# Patient Record
Sex: Male | Born: 1970 | Race: White | Hispanic: No | Marital: Married | Smoking: Never smoker
Health system: Southern US, Community
[De-identification: ages and names within clinical notes are randomized; demographics above are authoritative.]

## PROBLEM LIST (undated history)

## (undated) DIAGNOSIS — G4730 Sleep apnea, unspecified: Secondary | ICD-10-CM

## (undated) DIAGNOSIS — L570 Actinic keratosis: Secondary | ICD-10-CM

## (undated) HISTORY — DX: Actinic keratosis: L57.0

---

## 2007-04-13 ENCOUNTER — Ambulatory Visit: Payer: Self-pay | Admitting: Internal Medicine

## 2009-06-24 IMAGING — CT CT ABD-PELV W/ CM
1 of 2 series · 16 of 32 positions shown, 20 images · non-contrast
Comparison: none

REASON FOR EXAM: RUQ pain r/o appenditis Call report to Dr. Uest (on
call) pager [phone]
COMMENTS:

PROCEDURE:     CT  - CT ABDOMEN / PELVIS  W  - April 13, 2007  [DATE]
RESULT:
HISTORY: RIGHT upper quadrant pain, rule out appendicitis.

[Series 2: appendicitis · axial · 0.68mm/px · z∈[-145,+281]mm · 16 of 156 slices shown, 20 images]
[im 7/156  soft-tissue]
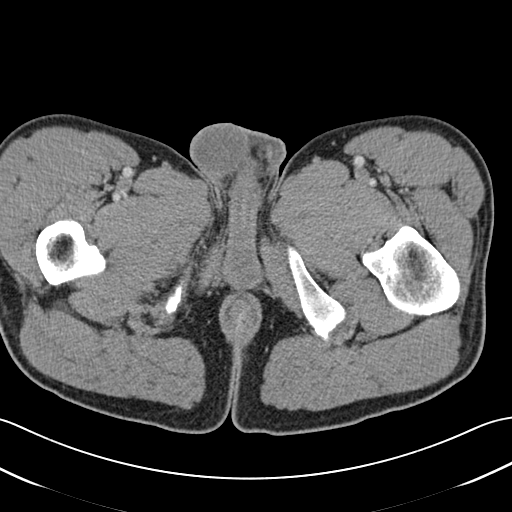
[im 7/156  bone]
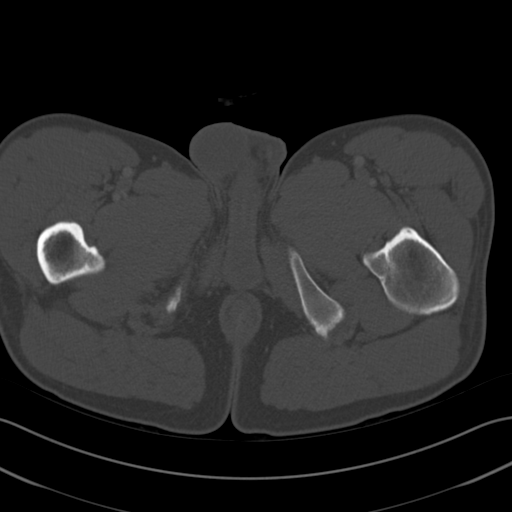
[im 20/156  soft-tissue]
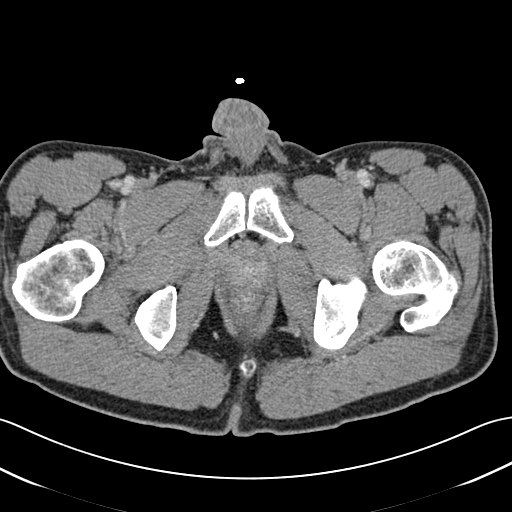
[im 33/156  soft-tissue]
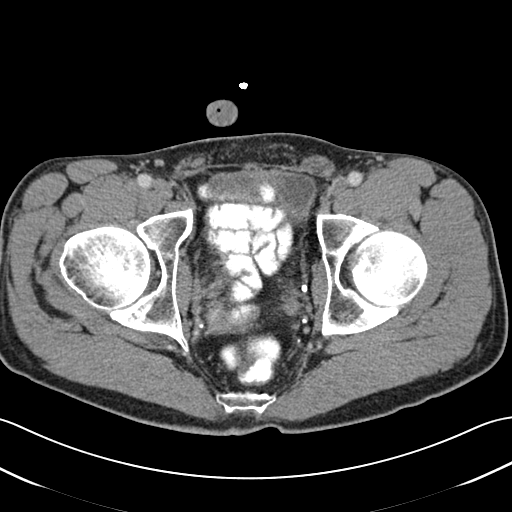
[im 39/156  soft-tissue]
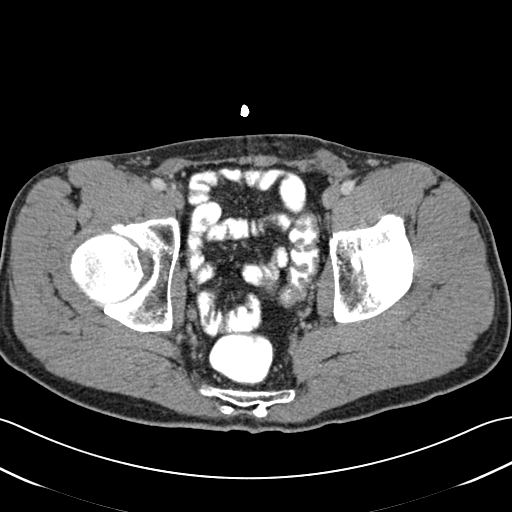
[im 52/156  soft-tissue]
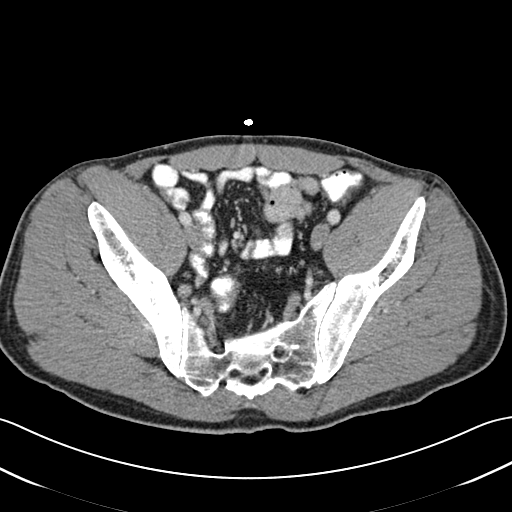
[im 65/156  soft-tissue]
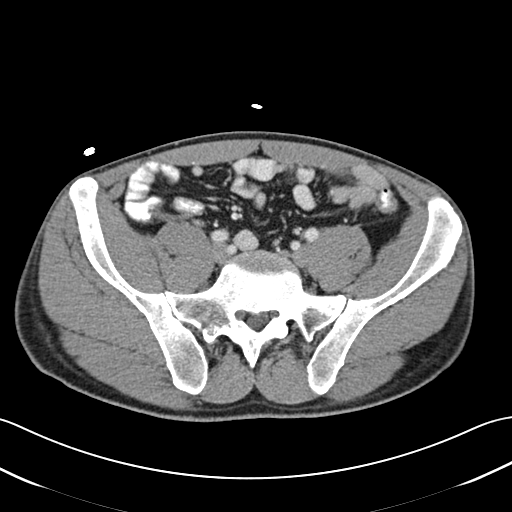
[im 72/156  soft-tissue]
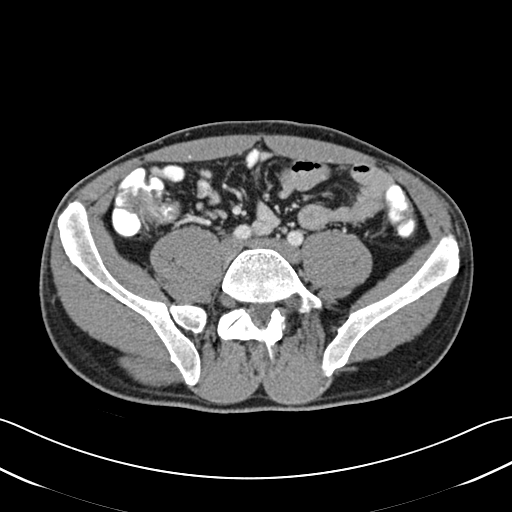
[im 84/156  soft-tissue]
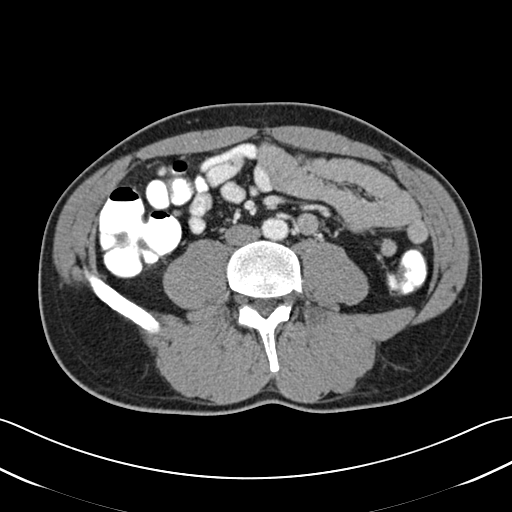
[im 91/156  soft-tissue]
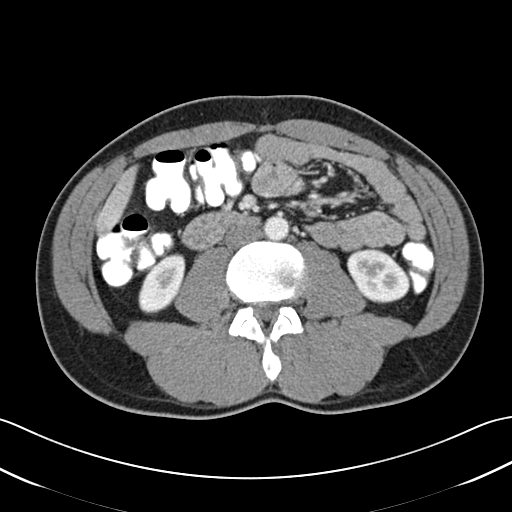
[im 91/156  bone]
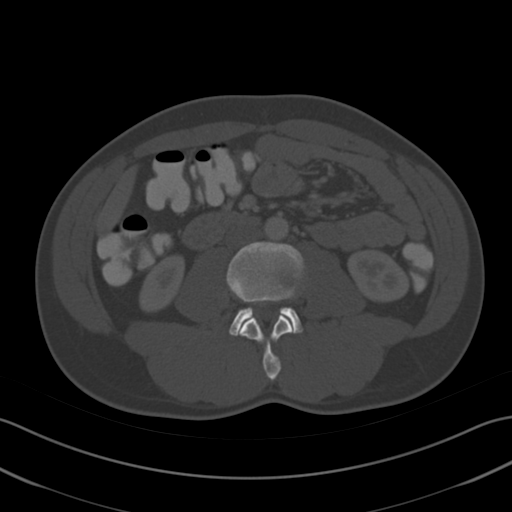
[im 104/156  soft-tissue]
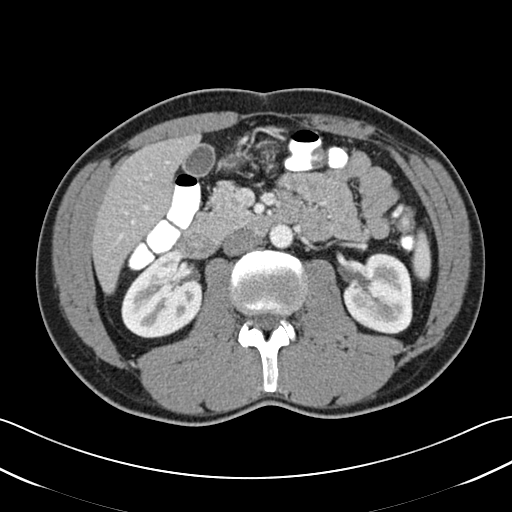
[im 117/156  soft-tissue]
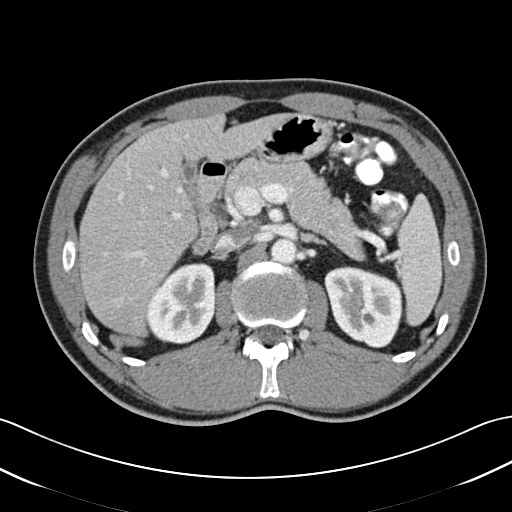
[im 123/156  soft-tissue]
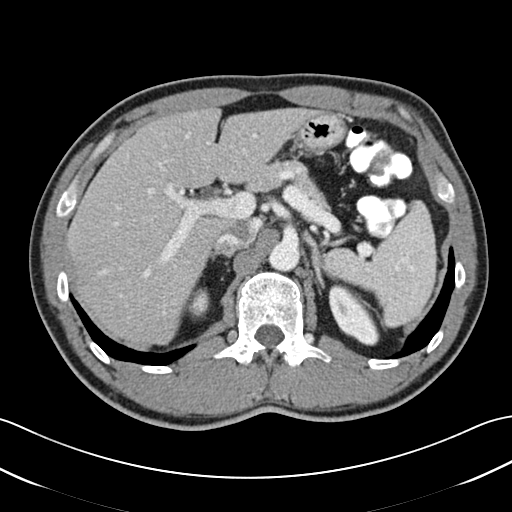
[im 130/156  lung]
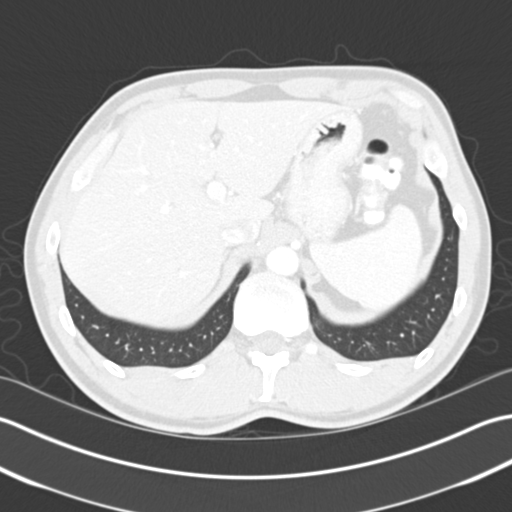
[im 136/156  soft-tissue]
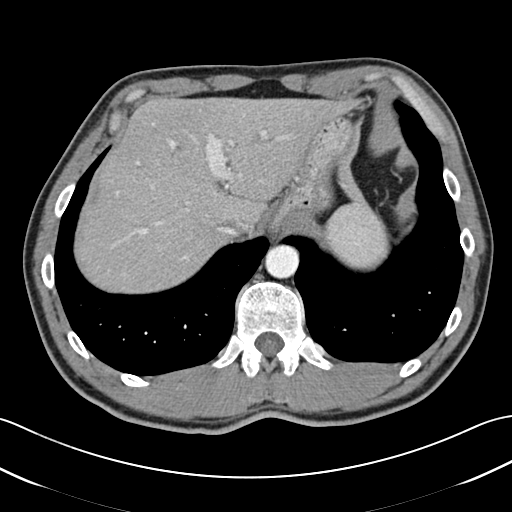
[im 136/156  lung]
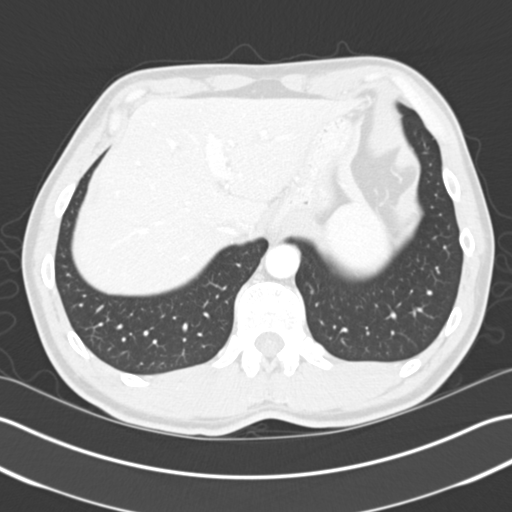
[im 143/156  lung]
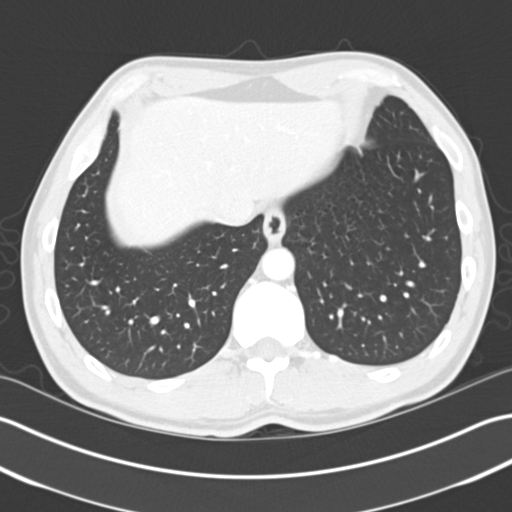
[im 149/156  soft-tissue]
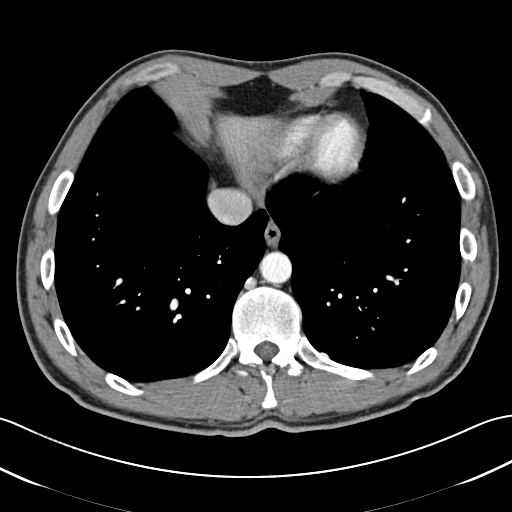
[im 149/156  lung]
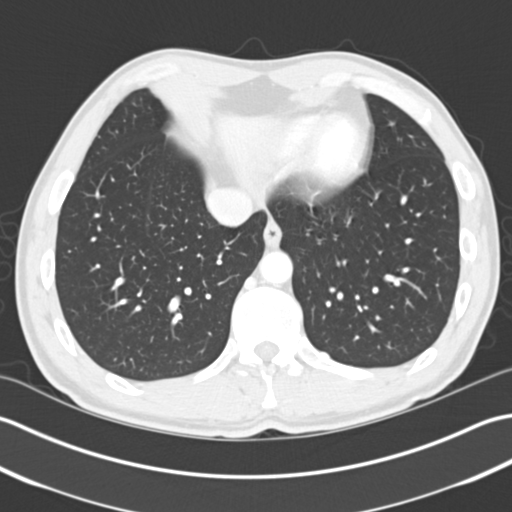

[16 of 32 positions shown; findings below may reference images not displayed]

FINDINGS: IV and oral contrast enhanced CT of the abdomen and pelvis was
obtained.  The liver and spleen are normal. The pancreas is normal.  The
gallbladder is unremarkable.  The adrenals are normal.  No focal renal
abnormality is identified. The abdominal aorta is unremarkable.  The
appendix is not well visualized but appears to be the appendix is normal.
No pericecal abnormalities are identified.  There is  no evidence of bowel
obstruction. Small mesenteric lymph nodes are noted. Mesenteric adenitis
could present in this fashion.  No pelvic abnormalities are identified. No
inguinal adenopathy is noted.
IMPRESSION: Findings suggesting mesenteric adenitis.

## 2009-08-15 ENCOUNTER — Ambulatory Visit: Payer: Self-pay | Admitting: Unknown Physician Specialty

## 2010-05-10 DIAGNOSIS — Z86018 Personal history of other benign neoplasm: Secondary | ICD-10-CM

## 2010-05-10 HISTORY — DX: Personal history of other benign neoplasm: Z86.018

## 2013-05-10 DIAGNOSIS — Z85828 Personal history of other malignant neoplasm of skin: Secondary | ICD-10-CM

## 2013-05-10 HISTORY — DX: Personal history of other malignant neoplasm of skin: Z85.828

## 2017-06-06 DIAGNOSIS — L309 Dermatitis, unspecified: Secondary | ICD-10-CM | POA: Diagnosis not present

## 2017-06-06 DIAGNOSIS — D229 Melanocytic nevi, unspecified: Secondary | ICD-10-CM | POA: Diagnosis not present

## 2017-06-06 DIAGNOSIS — L111 Transient acantholytic dermatosis [Grover]: Secondary | ICD-10-CM | POA: Diagnosis not present

## 2017-07-06 DIAGNOSIS — L609 Nail disorder, unspecified: Secondary | ICD-10-CM | POA: Diagnosis not present

## 2017-07-06 DIAGNOSIS — L309 Dermatitis, unspecified: Secondary | ICD-10-CM | POA: Diagnosis not present

## 2017-07-06 DIAGNOSIS — L111 Transient acantholytic dermatosis [Grover]: Secondary | ICD-10-CM | POA: Diagnosis not present

## 2017-07-20 DIAGNOSIS — M9902 Segmental and somatic dysfunction of thoracic region: Secondary | ICD-10-CM | POA: Diagnosis not present

## 2017-07-20 DIAGNOSIS — M9905 Segmental and somatic dysfunction of pelvic region: Secondary | ICD-10-CM | POA: Diagnosis not present

## 2017-07-20 DIAGNOSIS — M9903 Segmental and somatic dysfunction of lumbar region: Secondary | ICD-10-CM | POA: Diagnosis not present

## 2017-10-12 DIAGNOSIS — M9902 Segmental and somatic dysfunction of thoracic region: Secondary | ICD-10-CM | POA: Diagnosis not present

## 2017-10-12 DIAGNOSIS — M9905 Segmental and somatic dysfunction of pelvic region: Secondary | ICD-10-CM | POA: Diagnosis not present

## 2017-10-12 DIAGNOSIS — M9903 Segmental and somatic dysfunction of lumbar region: Secondary | ICD-10-CM | POA: Diagnosis not present

## 2017-11-14 DIAGNOSIS — M9903 Segmental and somatic dysfunction of lumbar region: Secondary | ICD-10-CM | POA: Diagnosis not present

## 2017-11-14 DIAGNOSIS — M9902 Segmental and somatic dysfunction of thoracic region: Secondary | ICD-10-CM | POA: Diagnosis not present

## 2017-11-14 DIAGNOSIS — M9905 Segmental and somatic dysfunction of pelvic region: Secondary | ICD-10-CM | POA: Diagnosis not present

## 2017-11-22 DIAGNOSIS — M9905 Segmental and somatic dysfunction of pelvic region: Secondary | ICD-10-CM | POA: Diagnosis not present

## 2017-11-22 DIAGNOSIS — M9902 Segmental and somatic dysfunction of thoracic region: Secondary | ICD-10-CM | POA: Diagnosis not present

## 2017-11-22 DIAGNOSIS — M9903 Segmental and somatic dysfunction of lumbar region: Secondary | ICD-10-CM | POA: Diagnosis not present

## 2017-12-21 DIAGNOSIS — M9903 Segmental and somatic dysfunction of lumbar region: Secondary | ICD-10-CM | POA: Diagnosis not present

## 2017-12-21 DIAGNOSIS — M9902 Segmental and somatic dysfunction of thoracic region: Secondary | ICD-10-CM | POA: Diagnosis not present

## 2017-12-21 DIAGNOSIS — M9905 Segmental and somatic dysfunction of pelvic region: Secondary | ICD-10-CM | POA: Diagnosis not present

## 2018-01-17 DIAGNOSIS — M9902 Segmental and somatic dysfunction of thoracic region: Secondary | ICD-10-CM | POA: Diagnosis not present

## 2018-01-17 DIAGNOSIS — M9903 Segmental and somatic dysfunction of lumbar region: Secondary | ICD-10-CM | POA: Diagnosis not present

## 2018-01-17 DIAGNOSIS — M9905 Segmental and somatic dysfunction of pelvic region: Secondary | ICD-10-CM | POA: Diagnosis not present

## 2018-04-11 DIAGNOSIS — M9902 Segmental and somatic dysfunction of thoracic region: Secondary | ICD-10-CM | POA: Diagnosis not present

## 2018-04-11 DIAGNOSIS — M9903 Segmental and somatic dysfunction of lumbar region: Secondary | ICD-10-CM | POA: Diagnosis not present

## 2018-04-11 DIAGNOSIS — M9905 Segmental and somatic dysfunction of pelvic region: Secondary | ICD-10-CM | POA: Diagnosis not present

## 2018-04-24 DIAGNOSIS — L578 Other skin changes due to chronic exposure to nonionizing radiation: Secondary | ICD-10-CM | POA: Diagnosis not present

## 2018-04-24 DIAGNOSIS — Z85828 Personal history of other malignant neoplasm of skin: Secondary | ICD-10-CM | POA: Diagnosis not present

## 2018-04-24 DIAGNOSIS — Z1283 Encounter for screening for malignant neoplasm of skin: Secondary | ICD-10-CM | POA: Diagnosis not present

## 2018-04-27 DIAGNOSIS — M9902 Segmental and somatic dysfunction of thoracic region: Secondary | ICD-10-CM | POA: Diagnosis not present

## 2018-04-27 DIAGNOSIS — M9903 Segmental and somatic dysfunction of lumbar region: Secondary | ICD-10-CM | POA: Diagnosis not present

## 2018-04-27 DIAGNOSIS — M9905 Segmental and somatic dysfunction of pelvic region: Secondary | ICD-10-CM | POA: Diagnosis not present

## 2018-04-28 DIAGNOSIS — Z0000 Encounter for general adult medical examination without abnormal findings: Secondary | ICD-10-CM | POA: Diagnosis not present

## 2018-04-28 DIAGNOSIS — G4700 Insomnia, unspecified: Secondary | ICD-10-CM | POA: Diagnosis not present

## 2018-04-28 DIAGNOSIS — L111 Transient acantholytic dermatosis [Grover]: Secondary | ICD-10-CM | POA: Diagnosis not present

## 2018-05-01 DIAGNOSIS — G4700 Insomnia, unspecified: Secondary | ICD-10-CM | POA: Diagnosis not present

## 2018-05-01 DIAGNOSIS — L111 Transient acantholytic dermatosis [Grover]: Secondary | ICD-10-CM | POA: Diagnosis not present

## 2018-05-01 DIAGNOSIS — Z0000 Encounter for general adult medical examination without abnormal findings: Secondary | ICD-10-CM | POA: Diagnosis not present

## 2018-05-02 DIAGNOSIS — M9903 Segmental and somatic dysfunction of lumbar region: Secondary | ICD-10-CM | POA: Diagnosis not present

## 2018-05-02 DIAGNOSIS — M9905 Segmental and somatic dysfunction of pelvic region: Secondary | ICD-10-CM | POA: Diagnosis not present

## 2018-05-02 DIAGNOSIS — M9902 Segmental and somatic dysfunction of thoracic region: Secondary | ICD-10-CM | POA: Diagnosis not present

## 2018-09-12 DIAGNOSIS — M9902 Segmental and somatic dysfunction of thoracic region: Secondary | ICD-10-CM | POA: Diagnosis not present

## 2018-09-12 DIAGNOSIS — M9905 Segmental and somatic dysfunction of pelvic region: Secondary | ICD-10-CM | POA: Diagnosis not present

## 2018-09-12 DIAGNOSIS — M9903 Segmental and somatic dysfunction of lumbar region: Secondary | ICD-10-CM | POA: Diagnosis not present

## 2019-04-03 DIAGNOSIS — M545 Low back pain: Secondary | ICD-10-CM | POA: Diagnosis not present

## 2019-04-03 DIAGNOSIS — M6080 Other myositis, unspecified site: Secondary | ICD-10-CM | POA: Diagnosis not present

## 2019-04-03 DIAGNOSIS — M9902 Segmental and somatic dysfunction of thoracic region: Secondary | ICD-10-CM | POA: Diagnosis not present

## 2019-04-03 DIAGNOSIS — M461 Sacroiliitis, not elsewhere classified: Secondary | ICD-10-CM | POA: Diagnosis not present

## 2019-04-03 DIAGNOSIS — M9905 Segmental and somatic dysfunction of pelvic region: Secondary | ICD-10-CM | POA: Diagnosis not present

## 2019-04-03 DIAGNOSIS — M62830 Muscle spasm of back: Secondary | ICD-10-CM | POA: Diagnosis not present

## 2019-04-03 DIAGNOSIS — M546 Pain in thoracic spine: Secondary | ICD-10-CM | POA: Diagnosis not present

## 2019-04-03 DIAGNOSIS — M9903 Segmental and somatic dysfunction of lumbar region: Secondary | ICD-10-CM | POA: Diagnosis not present

## 2019-04-26 DIAGNOSIS — D485 Neoplasm of uncertain behavior of skin: Secondary | ICD-10-CM | POA: Diagnosis not present

## 2019-04-26 DIAGNOSIS — D2261 Melanocytic nevi of right upper limb, including shoulder: Secondary | ICD-10-CM | POA: Diagnosis not present

## 2019-04-26 DIAGNOSIS — C44619 Basal cell carcinoma of skin of left upper limb, including shoulder: Secondary | ICD-10-CM | POA: Diagnosis not present

## 2019-04-26 DIAGNOSIS — D225 Melanocytic nevi of trunk: Secondary | ICD-10-CM | POA: Diagnosis not present

## 2019-04-26 DIAGNOSIS — D2262 Melanocytic nevi of left upper limb, including shoulder: Secondary | ICD-10-CM | POA: Diagnosis not present

## 2019-04-26 DIAGNOSIS — L570 Actinic keratosis: Secondary | ICD-10-CM | POA: Diagnosis not present

## 2019-04-26 DIAGNOSIS — L700 Acne vulgaris: Secondary | ICD-10-CM | POA: Diagnosis not present

## 2019-04-26 DIAGNOSIS — Z1283 Encounter for screening for malignant neoplasm of skin: Secondary | ICD-10-CM | POA: Diagnosis not present

## 2019-04-26 DIAGNOSIS — L578 Other skin changes due to chronic exposure to nonionizing radiation: Secondary | ICD-10-CM | POA: Diagnosis not present

## 2019-04-26 DIAGNOSIS — Q825 Congenital non-neoplastic nevus: Secondary | ICD-10-CM | POA: Diagnosis not present

## 2019-06-11 DIAGNOSIS — G4700 Insomnia, unspecified: Secondary | ICD-10-CM | POA: Diagnosis not present

## 2019-06-11 DIAGNOSIS — Z85828 Personal history of other malignant neoplasm of skin: Secondary | ICD-10-CM | POA: Diagnosis not present

## 2019-06-11 DIAGNOSIS — Z0000 Encounter for general adult medical examination without abnormal findings: Secondary | ICD-10-CM | POA: Diagnosis not present

## 2019-06-20 DIAGNOSIS — G4700 Insomnia, unspecified: Secondary | ICD-10-CM | POA: Diagnosis not present

## 2019-06-20 DIAGNOSIS — Z0000 Encounter for general adult medical examination without abnormal findings: Secondary | ICD-10-CM | POA: Diagnosis not present

## 2019-07-10 DIAGNOSIS — C44619 Basal cell carcinoma of skin of left upper limb, including shoulder: Secondary | ICD-10-CM | POA: Diagnosis not present

## 2019-07-18 DIAGNOSIS — Z4802 Encounter for removal of sutures: Secondary | ICD-10-CM | POA: Diagnosis not present

## 2019-07-18 DIAGNOSIS — C44619 Basal cell carcinoma of skin of left upper limb, including shoulder: Secondary | ICD-10-CM | POA: Diagnosis not present

## 2019-08-21 DIAGNOSIS — M9903 Segmental and somatic dysfunction of lumbar region: Secondary | ICD-10-CM | POA: Diagnosis not present

## 2019-08-21 DIAGNOSIS — M6080 Other myositis, unspecified site: Secondary | ICD-10-CM | POA: Diagnosis not present

## 2019-08-21 DIAGNOSIS — M9905 Segmental and somatic dysfunction of pelvic region: Secondary | ICD-10-CM | POA: Diagnosis not present

## 2019-08-21 DIAGNOSIS — M62830 Muscle spasm of back: Secondary | ICD-10-CM | POA: Diagnosis not present

## 2019-08-21 DIAGNOSIS — M545 Low back pain: Secondary | ICD-10-CM | POA: Diagnosis not present

## 2019-08-21 DIAGNOSIS — M461 Sacroiliitis, not elsewhere classified: Secondary | ICD-10-CM | POA: Diagnosis not present

## 2019-08-21 DIAGNOSIS — M9902 Segmental and somatic dysfunction of thoracic region: Secondary | ICD-10-CM | POA: Diagnosis not present

## 2019-08-21 DIAGNOSIS — M546 Pain in thoracic spine: Secondary | ICD-10-CM | POA: Diagnosis not present

## 2019-08-30 DIAGNOSIS — M9902 Segmental and somatic dysfunction of thoracic region: Secondary | ICD-10-CM | POA: Diagnosis not present

## 2019-08-30 DIAGNOSIS — M62830 Muscle spasm of back: Secondary | ICD-10-CM | POA: Diagnosis not present

## 2019-08-30 DIAGNOSIS — M9905 Segmental and somatic dysfunction of pelvic region: Secondary | ICD-10-CM | POA: Diagnosis not present

## 2019-08-30 DIAGNOSIS — M6080 Other myositis, unspecified site: Secondary | ICD-10-CM | POA: Diagnosis not present

## 2019-08-30 DIAGNOSIS — M546 Pain in thoracic spine: Secondary | ICD-10-CM | POA: Diagnosis not present

## 2019-08-30 DIAGNOSIS — M545 Low back pain: Secondary | ICD-10-CM | POA: Diagnosis not present

## 2019-08-30 DIAGNOSIS — M461 Sacroiliitis, not elsewhere classified: Secondary | ICD-10-CM | POA: Diagnosis not present

## 2019-08-30 DIAGNOSIS — M9903 Segmental and somatic dysfunction of lumbar region: Secondary | ICD-10-CM | POA: Diagnosis not present

## 2019-10-16 DIAGNOSIS — M9903 Segmental and somatic dysfunction of lumbar region: Secondary | ICD-10-CM | POA: Diagnosis not present

## 2019-10-16 DIAGNOSIS — M6080 Other myositis, unspecified site: Secondary | ICD-10-CM | POA: Diagnosis not present

## 2019-10-16 DIAGNOSIS — M9905 Segmental and somatic dysfunction of pelvic region: Secondary | ICD-10-CM | POA: Diagnosis not present

## 2019-10-16 DIAGNOSIS — M545 Low back pain: Secondary | ICD-10-CM | POA: Diagnosis not present

## 2019-10-16 DIAGNOSIS — M62830 Muscle spasm of back: Secondary | ICD-10-CM | POA: Diagnosis not present

## 2019-10-16 DIAGNOSIS — M546 Pain in thoracic spine: Secondary | ICD-10-CM | POA: Diagnosis not present

## 2019-10-16 DIAGNOSIS — M461 Sacroiliitis, not elsewhere classified: Secondary | ICD-10-CM | POA: Diagnosis not present

## 2019-10-16 DIAGNOSIS — M9902 Segmental and somatic dysfunction of thoracic region: Secondary | ICD-10-CM | POA: Diagnosis not present

## 2019-10-29 DIAGNOSIS — Z135 Encounter for screening for eye and ear disorders: Secondary | ICD-10-CM | POA: Diagnosis not present

## 2019-10-29 DIAGNOSIS — H524 Presbyopia: Secondary | ICD-10-CM | POA: Diagnosis not present

## 2019-10-29 DIAGNOSIS — H40033 Anatomical narrow angle, bilateral: Secondary | ICD-10-CM | POA: Diagnosis not present

## 2019-12-06 DIAGNOSIS — H40033 Anatomical narrow angle, bilateral: Secondary | ICD-10-CM | POA: Diagnosis not present

## 2019-12-11 DIAGNOSIS — M9902 Segmental and somatic dysfunction of thoracic region: Secondary | ICD-10-CM | POA: Diagnosis not present

## 2019-12-11 DIAGNOSIS — M6080 Other myositis, unspecified site: Secondary | ICD-10-CM | POA: Diagnosis not present

## 2019-12-11 DIAGNOSIS — M461 Sacroiliitis, not elsewhere classified: Secondary | ICD-10-CM | POA: Diagnosis not present

## 2019-12-11 DIAGNOSIS — M9905 Segmental and somatic dysfunction of pelvic region: Secondary | ICD-10-CM | POA: Diagnosis not present

## 2019-12-11 DIAGNOSIS — M9903 Segmental and somatic dysfunction of lumbar region: Secondary | ICD-10-CM | POA: Diagnosis not present

## 2019-12-11 DIAGNOSIS — M546 Pain in thoracic spine: Secondary | ICD-10-CM | POA: Diagnosis not present

## 2019-12-11 DIAGNOSIS — M545 Low back pain: Secondary | ICD-10-CM | POA: Diagnosis not present

## 2019-12-11 DIAGNOSIS — M62830 Muscle spasm of back: Secondary | ICD-10-CM | POA: Diagnosis not present

## 2020-02-13 ENCOUNTER — Other Ambulatory Visit: Payer: Self-pay

## 2020-02-13 ENCOUNTER — Telehealth (INDEPENDENT_AMBULATORY_CARE_PROVIDER_SITE_OTHER): Payer: Self-pay | Admitting: Gastroenterology

## 2020-02-13 DIAGNOSIS — Z1211 Encounter for screening for malignant neoplasm of colon: Secondary | ICD-10-CM

## 2020-02-13 MED ORDER — NA SULFATE-K SULFATE-MG SULF 17.5-3.13-1.6 GM/177ML PO SOLN: 1.0000 | Freq: Once | ORAL | 0 refills | Status: DC

## 2020-02-13 NOTE — Progress Notes (Signed)
Gastroenterology Pre-Procedure Review  Request Date: Thursday 03/14/23 Requesting Physician: Dr. Vicente Males  PATIENT REVIEW QUESTIONS: The patient responded to the following health history questions as indicated:    1. Are you having any GI issues? occasional heartburn 2. Do you have a personal history of Polyps? yes (08/15/09 Dr. Vira Agar removed hyperplastic polyp.  Colonoscopy repeated by Dr. Vira Agar on 10/15/14 no polyps) 3. Do you have a family history of Colon Cancer or Polyps? yes (father colon polyps, both paternal grandparents had colon cancer) 8. Diabetes Mellitus? no 5. Joint replacements in the past 12 months?no 6. Major health problems in the past 3 months?no 7. Any artificial heart valves, MVP, or defibrillator?no8. Patient does have sleep apnea.      MEDICATIONS & ALLERGIES:    Patient reports the following regarding taking any anticoagulation/antiplatelet therapy:   Plavix, Coumadin, Eliquis, Xarelto, Lovenox, Pradaxa, Brilinta, or Effient? no Aspirin? no  Patient confirms/reports the following medications:  Current Outpatient Medications  Medication Sig Dispense Refill  . diphenhydrAMINE HCl (ALLERGY MEDICATION PO) Take by mouth as needed. As Needed     No current facility-administered medications for this visit.    Patient confirms/reports the following allergies:  No Known Allergies  No orders of the defined types were placed in this encounter.   AUTHORIZATION INFORMATION Primary Insurance: 1D#: Group #:  Secondary Insurance: 1D#: Group #:  SCHEDULE INFORMATION: Date: Thursday 03/13/20 Time: Location:[facility]

## 2020-02-14 DIAGNOSIS — M461 Sacroiliitis, not elsewhere classified: Secondary | ICD-10-CM | POA: Diagnosis not present

## 2020-02-14 DIAGNOSIS — M9902 Segmental and somatic dysfunction of thoracic region: Secondary | ICD-10-CM | POA: Diagnosis not present

## 2020-02-14 DIAGNOSIS — M62830 Muscle spasm of back: Secondary | ICD-10-CM | POA: Diagnosis not present

## 2020-02-14 DIAGNOSIS — M546 Pain in thoracic spine: Secondary | ICD-10-CM | POA: Diagnosis not present

## 2020-02-14 DIAGNOSIS — M9905 Segmental and somatic dysfunction of pelvic region: Secondary | ICD-10-CM | POA: Diagnosis not present

## 2020-02-14 DIAGNOSIS — M9903 Segmental and somatic dysfunction of lumbar region: Secondary | ICD-10-CM | POA: Diagnosis not present

## 2020-02-14 DIAGNOSIS — M545 Low back pain, unspecified: Secondary | ICD-10-CM | POA: Diagnosis not present

## 2020-02-14 DIAGNOSIS — M6080 Other myositis, unspecified site: Secondary | ICD-10-CM | POA: Diagnosis not present

## 2020-02-18 ENCOUNTER — Ambulatory Visit: Payer: 59 | Attending: Internal Medicine

## 2020-02-18 DIAGNOSIS — Z23 Encounter for immunization: Secondary | ICD-10-CM

## 2020-02-18 NOTE — Progress Notes (Signed)
   Covid-19 Vaccination Clinic  Name:  Carvin Almas    MRN: [id number] DOB: 07-05-1970  02/18/2020  Mr. Sheffler was observed post Covid-19 immunization for 15 minutes without incident. He was provided with Vaccine Information Sheet and instruction to access the V-Safe system.   Mr. Zelman was instructed to call 911 with any severe reactions post vaccine: Marland Kitchen Difficulty breathing  . Swelling of face and throat  . A fast heartbeat  . A bad rash all over body  . Dizziness and weakness

## 2020-03-11 ENCOUNTER — Other Ambulatory Visit: Payer: Self-pay

## 2020-03-11 ENCOUNTER — Other Ambulatory Visit
Admission: RE | Admit: 2020-03-11 | Discharge: 2020-03-11 | Disposition: A | Discharge status: Home / Self Care | Payer: 59 | Source: Ambulatory Visit | Attending: Gastroenterology | Admitting: Gastroenterology

## 2020-03-11 DIAGNOSIS — Z20822 Contact with and (suspected) exposure to covid-19: Secondary | ICD-10-CM | POA: Diagnosis not present

## 2020-03-11 DIAGNOSIS — Z01812 Encounter for preprocedural laboratory examination: Secondary | ICD-10-CM | POA: Insufficient documentation

## 2020-03-11 LAB — SARS CORONAVIRUS 2 (TAT 6-24 HRS): SARS Coronavirus 2: NEGATIVE

## 2020-03-12 ENCOUNTER — Encounter: Payer: Self-pay | Admitting: Gastroenterology

## 2020-03-13 ENCOUNTER — Ambulatory Visit: Payer: 59 | Admitting: Certified Registered Nurse Anesthetist

## 2020-03-13 ENCOUNTER — Ambulatory Visit
Admission: RE | Admit: 2020-03-13 | Discharge: 2020-03-13 | Disposition: A | Discharge status: Home / Self Care | Payer: 59 | Attending: Gastroenterology | Admitting: Gastroenterology

## 2020-03-13 ENCOUNTER — Encounter: Payer: Self-pay | Admitting: Gastroenterology

## 2020-03-13 ENCOUNTER — Other Ambulatory Visit: Payer: Self-pay

## 2020-03-13 ENCOUNTER — Encounter
Admission: RE | Disposition: A | Discharge status: Home / Self Care | Payer: Self-pay | Source: Home / Self Care | Attending: Gastroenterology

## 2020-03-13 DIAGNOSIS — K640 First degree hemorrhoids: Secondary | ICD-10-CM | POA: Diagnosis not present

## 2020-03-13 DIAGNOSIS — Z86010 Personal history of colonic polyps: Secondary | ICD-10-CM | POA: Diagnosis not present

## 2020-03-13 DIAGNOSIS — Z8719 Personal history of other diseases of the digestive system: Secondary | ICD-10-CM | POA: Diagnosis not present

## 2020-03-13 DIAGNOSIS — K635 Polyp of colon: Secondary | ICD-10-CM | POA: Diagnosis not present

## 2020-03-13 DIAGNOSIS — Z1211 Encounter for screening for malignant neoplasm of colon: Secondary | ICD-10-CM | POA: Insufficient documentation

## 2020-03-13 HISTORY — PX: COLONOSCOPY WITH PROPOFOL: SHX5780

## 2020-03-13 HISTORY — DX: Sleep apnea, unspecified: G47.30

## 2020-03-13 SURGERY — COLONOSCOPY WITH PROPOFOL
Anesthesia: General

## 2020-03-13 MED ORDER — PROPOFOL 10 MG/ML IV BOLUS
INTRAVENOUS | Status: AC
  Filled 2020-03-13: qty 20

## 2020-03-13 MED ORDER — LIDOCAINE HCL (CARDIAC) PF 100 MG/5ML IV SOSY
PREFILLED_SYRINGE | INTRAVENOUS | Status: DC | PRN
  Administered 2020-03-13: 50 mg via INTRAVENOUS

## 2020-03-13 MED ORDER — PROPOFOL 500 MG/50ML IV EMUL
INTRAVENOUS | Status: AC
  Filled 2020-03-13: qty 50

## 2020-03-13 MED ORDER — PROPOFOL 10 MG/ML IV BOLUS
INTRAVENOUS | Status: DC | PRN
  Administered 2020-03-13 (×2): 40 mg via INTRAVENOUS

## 2020-03-13 MED ORDER — SODIUM CHLORIDE 0.9 % IV SOLN: INTRAVENOUS | Status: DC

## 2020-03-13 MED ORDER — PROPOFOL 500 MG/50ML IV EMUL
INTRAVENOUS | Status: DC | PRN
  Administered 2020-03-13: 125 ug/kg/min via INTRAVENOUS

## 2020-03-13 NOTE — H&P (Signed)
Jonathon Bellows, MD [address] [place], [place], [place], [address] [address], [place], [place], [place], [address] Phone: [phone]  Fax: [phone]  Primary Care Physician:  Tracie Harrier, MD   Pre-Procedure History & Physical: HPI:  Kyle Lee is a 49 y.o. male is here for an colonoscopy.   Past Medical History:  Diagnosis Date  . Hx of basal cell carcinoma 2015   right medial cheek   . Hx of dysplastic nevus 2012   multiple sites  . Sleep apnea    No CPAP    History reviewed. No pertinent surgical history.  Prior to Admission medications   Medication Sig Start Date End Date Taking? Authorizing Provider  diphenhydrAMINE HCl (ALLERGY MEDICATION PO) Take by mouth as needed. As Needed   Yes [provider]    Allergies as of 02/13/2020  . (No Known Allergies)    History reviewed. No pertinent family history.  Social History   Socioeconomic History  . Marital status: Married    Spouse name: Not on file  . Number of children: Not on file  . Years of education: Not on file  . Highest education level: Not on file  Occupational History  . Not on file  Tobacco Use  . Smoking status: Never Smoker  . Smokeless tobacco: Never Used  Vaping Use  . Vaping Use: Never used  Substance and Sexual Activity  . Alcohol use: Never  . Drug use: Not Currently  . Sexual activity: Not on file  Other Topics Concern  . Not on file  Social History Narrative  . Not on file   Social Determinants of Health   Financial Resource Strain:   . Difficulty of Paying Living Expenses: Not on file  Food Insecurity:   . Worried About Charity fundraiser in the Last Year: Not on file  . Ran Out of Food in the Last Year: Not on file  Transportation Needs:   . Lack of Transportation (Medical): Not on file  . Lack of Transportation (Non-Medical): Not on file  Physical Activity:   . Days of Exercise per Week: Not on file  . Minutes of Exercise per Session: Not on  file  Stress:   . Feeling of Stress : Not on file  Social Connections:   . Frequency of Communication with Friends and Family: Not on file  . Frequency of Social Gatherings with Friends and Family: Not on file  . Attends Religious Services: Not on file  . Active Member of Clubs or Organizations: Not on file  . Attends Archivist Meetings: Not on file  . Marital Status: Not on file  Intimate Partner Violence:   . Fear of Current or Ex-Partner: Not on file  . Emotionally Abused: Not on file  . Physically Abused: Not on file  . Sexually Abused: Not on file    Review of Systems: See HPI, otherwise negative ROS  Physical Exam: BP 127/87   Pulse 91   Temp (!) 96.4 F (35.8 C) (Temporal)   Resp 17   Ht 6' (1.829 m)   Wt 81.6 kg   SpO2 97%   BMI 24.41 kg/m  General:   Alert,  pleasant and cooperative in NAD Head:  Normocephalic and atraumatic. Neck:  Supple; no masses or thyromegaly. Lungs:  Clear throughout to auscultation, normal respiratory effort.    Heart:  +S1, +S2, Regular rate and rhythm, No edema. Abdomen:  Soft, nontender and nondistended. Normal bowel sounds, without guarding,  and without rebound.   Neurologic:  Alert and  oriented x4;  grossly normal neurologically.  Impression/Plan: Kyle Lee is here for an colonoscopy to be performed for surveillance due to prior history of colon polyps   Risks, benefits, limitations, and alternatives regarding  colonoscopy have been reviewed with the patient.  Questions have been answered.  All parties agreeable.   Jonathon Bellows, MD  03/13/2020, 8:07 AM

## 2020-03-13 NOTE — Anesthesia Preprocedure Evaluation (Signed)
Anesthesia Evaluation  Patient identified by MRN, date of birth, ID band Patient awake    Reviewed: Allergy & Precautions, NPO status , Patient's Chart, lab work & pertinent test results  Airway Mallampati: II  TM Distance: >3 FB     Dental no notable dental hx. (+) Teeth Intact   Pulmonary sleep apnea ,    Pulmonary exam normal        Cardiovascular negative cardio ROS Normal cardiovascular exam     Neuro/Psych negative neurological ROS  negative psych ROS   GI/Hepatic negative GI ROS, Neg liver ROS,   Endo/Other  negative endocrine ROS  Renal/GU negative Renal ROS  negative genitourinary   Musculoskeletal negative musculoskeletal ROS (+)   Abdominal Normal abdominal exam  (+)   Peds negative pediatric ROS (+)  Hematology negative hematology ROS (+)   Anesthesia Other Findings Past Medical History: 2015: Hx of basal cell carcinoma     Comment:  right medial cheek  2012: Hx of dysplastic nevus     Comment:  multiple sites No date: Sleep apnea     Comment:  No CPAP  Reproductive/Obstetrics                             Anesthesia Physical Anesthesia Plan  ASA: II  Anesthesia Plan: General   Post-op Pain Management:    Induction: Intravenous  PONV Risk Score and Plan: Propofol infusion  Airway Management Planned: Nasal Cannula  Additional Equipment:   Intra-op Plan:   Post-operative Plan:   Informed Consent: I have reviewed the patients History and Physical, chart, labs and discussed the procedure including the risks, benefits and alternatives for the proposed anesthesia with the patient or authorized representative who has indicated his/her understanding and acceptance.     Dental advisory given  Plan Discussed with: CRNA and Surgeon  Anesthesia Plan Comments:         Anesthesia Quick Evaluation

## 2020-03-13 NOTE — Anesthesia Postprocedure Evaluation (Signed)
Anesthesia Post Note  Patient: Kyle Lee  Procedure(s) Performed: COLONOSCOPY WITH PROPOFOL (N/A )  Patient location during evaluation: Endoscopy Anesthesia Type: General Level of consciousness: awake and alert and oriented Pain management: pain level controlled Vital Signs Assessment: post-procedure vital signs reviewed and stable Respiratory status: spontaneous breathing Cardiovascular status: blood pressure returned to baseline Anesthetic complications: no   No complications documented.   Last Vitals:  Vitals:   03/13/20 0834 03/13/20 0838  BP: (!) 88/58 (!) 87/61  Pulse:    Resp:    Temp: (!) 35.9 C   SpO2:      Last Pain:  Vitals:   03/13/20 0844  TempSrc:   PainSc: 0-No pain                 Marikay Roads

## 2020-03-13 NOTE — Op Note (Signed)
[facility] Gastroenterology Patient Name: Kyle Lee Procedure Date: 03/13/2020 8:06 AM MRN: [id number] Account #: [id number] Date of Birth: Nov 16, 1970 Admit Type: Outpatient Age: 49 Room: [facility] ENDO ROOM 3 Gender: Male Note Status: Finalized Procedure:             Colonoscopy Indications:           High risk colon cancer surveillance: Personal history                         of colonic polyps, Last colonoscopy: April 2011 Providers:             Jonathon Bellows MD, MD Referring MD:          Tracie Harrier, MD (Referring MD) Medicines:             Monitored Anesthesia Care Complications:         No immediate complications. Procedure:             Pre-Anesthesia Assessment:                        - Prior to the procedure, a History and Physical was                         performed, and patient medications, allergies and                         sensitivities were reviewed. The patient's tolerance                         of previous anesthesia was reviewed.                        - The risks and benefits of the procedure and the                         sedation options and risks were discussed with the                         patient. All questions were answered and informed                         consent was obtained.                        - ASA Grade Assessment: II - A patient with mild                         systemic disease.                        After obtaining informed consent, the colonoscope was                         passed under direct vision. Throughout the procedure,                         the patient's blood pressure, pulse, and oxygen                         saturations were monitored  continuously. The                         Colonoscope was introduced through the anus and                         advanced to the the cecum, identified by the                         appendiceal orifice. The colonoscopy was performed                         with  ease. The patient tolerated the procedure well.                         The quality of the bowel preparation was excellent. Findings:      The perianal and digital rectal examinations were normal.      Two sessile polyps were found in the transverse colon. The polyps were 3       to 4 mm in size. These polyps were removed with a cold snare. Resection       and retrieval were complete.      Non-bleeding internal hemorrhoids were found during retroflexion. The       hemorrhoids were medium-sized and Grade I (internal hemorrhoids that do       not prolapse).      The exam was otherwise without abnormality on direct and retroflexion       views. Impression:            - Two 3 to 4 mm polyps in the transverse colon,                         removed with a cold snare. Resected and retrieved.                        - Non-bleeding internal hemorrhoids.                        - The examination was otherwise normal on direct and                         retroflexion views. Recommendation:        - Discharge patient to home (with escort).                        - Resume previous diet.                        - Continue present medications.                        - Await pathology results.                        - Repeat colonoscopy for surveillance based on                         pathology results. Procedure Code(s):     --- Professional ---  45385, Colonoscopy, flexible; with removal of                         tumor(s), polyp(s), or other lesion(s) by snare                         technique Diagnosis Code(s):     --- Professional ---                        Z86.010, Personal history of colonic polyps                        K63.5, Polyp of colon                        K64.0, First degree hemorrhoids CPT copyright 2019 American Medical Association. All rights reserved. The codes documented in this report are preliminary and upon coder review may  be revised to meet current  compliance requirements. Jonathon Bellows, MD Jonathon Bellows MD, MD 03/13/2020 8:33:56 AM This report has been signed electronically. Number of Addenda: 0 Note Initiated On: 03/13/2020 8:06 AM Scope Withdrawal Time: 0 hours 15 minutes 13 seconds  Total Procedure Duration: 0 hours 19 minutes 17 seconds  Estimated Blood Loss:  Estimated blood loss: none.      [facility]

## 2020-03-13 NOTE — Transfer of Care (Signed)
Immediate Anesthesia Transfer of Care Note  Patient: Kyle Lee  Procedure(s) Performed: COLONOSCOPY WITH PROPOFOL (N/A )  Patient Location: PACU  Anesthesia Type:General  Level of Consciousness: awake, alert  and oriented  Airway & Oxygen Therapy: Patient Spontanous Breathing and Patient connected to nasal cannula oxygen  Post-op Assessment: Report given to RN and Post -op Vital signs reviewed and stable  Post vital signs: Reviewed and stable  Last Vitals:  Vitals Value Taken Time  BP 88/58 03/13/20 0835  Temp    Pulse 67 03/13/20 0836  Resp 8 03/13/20 0836  SpO2 98 % 03/13/20 0836  Vitals shown include unvalidated device data.  Last Pain:  Vitals:   03/13/20 0714  TempSrc: Temporal  PainSc: 0-No pain         Complications: No complications documented.

## 2020-03-14 ENCOUNTER — Encounter: Payer: Self-pay | Admitting: Gastroenterology

## 2020-03-14 LAB — SURGICAL PATHOLOGY

## 2020-03-17 ENCOUNTER — Ambulatory Visit (INDEPENDENT_AMBULATORY_CARE_PROVIDER_SITE_OTHER): Payer: 59 | Admitting: Dermatology

## 2020-03-17 ENCOUNTER — Encounter: Payer: Self-pay | Admitting: Dermatology

## 2020-03-17 ENCOUNTER — Encounter: Payer: Self-pay | Admitting: Gastroenterology

## 2020-03-17 ENCOUNTER — Other Ambulatory Visit: Payer: Self-pay

## 2020-03-17 DIAGNOSIS — Z85828 Personal history of other malignant neoplasm of skin: Secondary | ICD-10-CM

## 2020-03-17 DIAGNOSIS — L820 Inflamed seborrheic keratosis: Secondary | ICD-10-CM | POA: Diagnosis not present

## 2020-03-17 DIAGNOSIS — B079 Viral wart, unspecified: Secondary | ICD-10-CM

## 2020-03-17 DIAGNOSIS — L821 Other seborrheic keratosis: Secondary | ICD-10-CM

## 2020-03-17 DIAGNOSIS — D229 Melanocytic nevi, unspecified: Secondary | ICD-10-CM

## 2020-03-17 NOTE — Progress Notes (Signed)
   Follow-Up Visit   Subjective  Kyle Lee is a 49 y.o. male who presents for the following: Lesion (on the left cheek - present for about 3 weeks, pt c/o itching and has been using Lotrimin AF cream). He also has a wart on finger.  He has hx of BCC.  The following portions of the chart were reviewed this encounter and updated as appropriate:  Tobacco  Allergies  Meds  Problems  Med Hx  Surg Hx  Fam Hx     Review of Systems:  No other skin or systemic complaints except as noted in HPI or Assessment and Plan.  Objective  Well appearing patient in no apparent distress; mood and affect are within normal limits.  A focused examination was performed including face . Relevant physical exam findings are noted in the Assessment and Plan.  Objective  L sideburn x 1: Erythematous keratotic or waxy stuck-on papule or plaque.   Objective  R 4th finger: Verrucous papules -- Discussed viral etiology and contagion.   Assessment & Plan  Inflamed seborrheic keratosis L sideburn x 1  Destruction of lesion - L sideburn x 1 Complexity: simple   Destruction method: cryotherapy   Informed consent: discussed and consent obtained   Timeout:  patient name, date of birth, surgical site, and procedure verified Lesion destroyed using liquid nitrogen: Yes   Region frozen until ice ball extended beyond lesion: Yes   Outcome: patient tolerated procedure well with no complications   Post-procedure details: wound care instructions given    Viral warts, unspecified type R 4th finger  Destruction of lesion - R 4th finger Complexity: simple   Destruction method: cryotherapy   Informed consent: discussed and consent obtained   Timeout:  patient name, date of birth, surgical site, and procedure verified Lesion destroyed using liquid nitrogen: Yes   Region frozen until ice ball extended beyond lesion: Yes   Outcome: patient tolerated procedure well with no complications   Post-procedure  details: wound care instructions given     Seborrheic Keratoses - Stuck-on, waxy, tan-brown papules and plaques  - Discussed benign etiology and prognosis. - Observe - Call for any changes  Melanocytic Nevi - Tan-brown and/or pink-flesh-colored symmetric macules and papules - Benign appearing on exam today - Observation - Call clinic for new or changing moles - Recommend daily use of broad spectrum spf 30+ sunscreen to sun-exposed areas.   History of Basal Cell Carcinoma of the Skin - No evidence of recurrence today - Recommend regular full body skin exams - Recommend daily broad spectrum sunscreen SPF 30+ to sun-exposed areas, reapply every 2 hours as needed.  - Call if any new or changing lesions are noted between office visits  Return for TBSE, appointment as scheduled.  Luther Redo, CMA, am acting as scribe for Sarina Ser, MD .  Documentation: I have reviewed the above documentation for accuracy and completeness, and I agree with the above.  Sarina Ser, MD

## 2020-04-28 ENCOUNTER — Encounter: Payer: Self-pay | Admitting: Dermatology

## 2020-05-21 DIAGNOSIS — H40033 Anatomical narrow angle, bilateral: Secondary | ICD-10-CM | POA: Diagnosis not present

## 2020-06-04 ENCOUNTER — Other Ambulatory Visit: Payer: Self-pay

## 2020-06-04 ENCOUNTER — Encounter: Payer: Self-pay | Admitting: Dermatology

## 2020-06-04 ENCOUNTER — Other Ambulatory Visit: Payer: Self-pay | Admitting: Dermatology

## 2020-06-04 ENCOUNTER — Ambulatory Visit (INDEPENDENT_AMBULATORY_CARE_PROVIDER_SITE_OTHER): Payer: 59 | Admitting: Dermatology

## 2020-06-04 DIAGNOSIS — Z85828 Personal history of other malignant neoplasm of skin: Secondary | ICD-10-CM | POA: Diagnosis not present

## 2020-06-04 DIAGNOSIS — Z1283 Encounter for screening for malignant neoplasm of skin: Secondary | ICD-10-CM | POA: Diagnosis not present

## 2020-06-04 DIAGNOSIS — L814 Other melanin hyperpigmentation: Secondary | ICD-10-CM

## 2020-06-04 DIAGNOSIS — L578 Other skin changes due to chronic exposure to nonionizing radiation: Secondary | ICD-10-CM

## 2020-06-04 DIAGNOSIS — L738 Other specified follicular disorders: Secondary | ICD-10-CM | POA: Diagnosis not present

## 2020-06-04 DIAGNOSIS — I781 Nevus, non-neoplastic: Secondary | ICD-10-CM

## 2020-06-04 DIAGNOSIS — L821 Other seborrheic keratosis: Secondary | ICD-10-CM

## 2020-06-04 DIAGNOSIS — D229 Melanocytic nevi, unspecified: Secondary | ICD-10-CM | POA: Diagnosis not present

## 2020-06-04 DIAGNOSIS — Z86018 Personal history of other benign neoplasm: Secondary | ICD-10-CM | POA: Diagnosis not present

## 2020-06-04 DIAGNOSIS — L111 Transient acantholytic dermatosis [Grover]: Secondary | ICD-10-CM

## 2020-06-04 DIAGNOSIS — D1800 Hemangioma unspecified site: Secondary | ICD-10-CM

## 2020-06-04 DIAGNOSIS — L820 Inflamed seborrheic keratosis: Secondary | ICD-10-CM | POA: Diagnosis not present

## 2020-06-04 MED ORDER — CLINDAMYCIN PHOSPHATE 1 % EX LOTN: TOPICAL_LOTION | Freq: Two times a day (BID) | CUTANEOUS | 11 refills | Status: DC

## 2020-06-04 NOTE — Patient Instructions (Addendum)

## 2020-06-04 NOTE — Progress Notes (Unsigned)
Follow-Up Visit   Subjective  Kyle Lee is a 50 y.o. male who presents for the following: Annual Exam. Pt c/o irritated itchy growths on the scalp and back he would like removed. Hx of BCC, Hx of Dysplastic nevus. The patient presents for Total-Body Skin Exam (TBSE) for skin cancer screening and mole check.  The following portions of the chart were reviewed this encounter and updated as appropriate:   Tobacco  Allergies  Meds  Problems  Med Hx  Surg Hx  Fam Hx     Review of Systems:  No other skin or systemic complaints except as noted in HPI or Assessment and Plan.  Objective  Well appearing patient in no apparent distress; mood and affect are within normal limits.  A full examination was performed including scalp, head, eyes, ears, nose, lips, neck, chest, axillae, abdomen, back, buttocks, bilateral upper extremities, bilateral lower extremities, hands, feet, fingers, toes, fingernails, and toenails. All findings within normal limits unless otherwise noted below.  Objective  multiple see history: Well healed scar with no evidence of recurrence.   Objective  multiple see history: Scar with no evidence of recurrence.   Objective  scalp x 3, back x 1 (4): Erythematous keratotic or waxy stuck-on papule or plaque.   Objective  exts, trunk: Red papules and papulovesicles.   Objective  L upper lip vermillion border: yellow papule   Images     Assessment & Plan  History of basal cell carcinoma (BCC) multiple see history  Clear. Observe for recurrence. Call clinic for new or changing lesions.  Recommend regular skin exams, daily broad-spectrum spf 30+ sunscreen use, and photoprotection.     History of dysplastic nevus multiple see history  Clear. Observe for recurrence. Call clinic for new or changing lesions.  Recommend regular skin exams, daily broad-spectrum spf 30+ sunscreen use, and photoprotection.     Inflamed seborrheic keratosis (4) scalp x 3,  back x 1  Destruction of lesion - scalp x 3, back x 1 Complexity: simple   Destruction method: cryotherapy   Informed consent: discussed and consent obtained   Timeout:  patient name, date of birth, surgical site, and procedure verified Lesion destroyed using liquid nitrogen: Yes   Region frozen until ice ball extended beyond lesion: Yes   Outcome: patient tolerated procedure well with no complications   Post-procedure details: wound care instructions given    Grover's disease exts, trunk Chronic and persistent  Stable  Cont Clindamycin lotion daily  Ordered Medications: clindamycin (CLEOCIN-T) 1 % lotion  Sebaceous hyperplasia L upper lip vermillion border Benign-appearing.  Observation.  Call clinic for new or changing lesions.  Recommend daily use of broad spectrum spf 30+ sunscreen to sun-exposed areas.   Lentigines - Scattered tan macules - Discussed due to sun exposure - Benign, observe - Call for any changes  Seborrheic Keratoses - Stuck-on, waxy, tan-brown papules and plaques  - Discussed benign etiology and prognosis. - Observe - Call for any changes  Melanocytic Nevi - Tan-brown and/or pink-flesh-colored symmetric macules and papules - Benign appearing on exam today - Observation - Call clinic for new or changing moles - Recommend daily use of broad spectrum spf 30+ sunscreen to sun-exposed areas.   Hemangiomas - Red papules - Discussed benign nature - Observe - Call for any changes  Actinic Damage - Chronic, secondary to cumulative UV/sun exposure - diffuse scaly erythematous macules with underlying dyspigmentation - Recommend daily broad spectrum sunscreen SPF 30+ to sun-exposed areas, reapply every 2  hours as needed.  - Call for new or changing lesions.  Skin cancer screening performed today.  Telangiectasia R palm - Dilated blood vessel - Benign appearing on exam - Call for changes  Return in about 1 year (around 06/04/2021) for TBSE  .  IMarye Round, CMA, am acting as scribe for Sarina Ser, MD .  Documentation: I have reviewed the above documentation for accuracy and completeness, and I agree with the above.  Sarina Ser, MD

## 2020-06-06 ENCOUNTER — Encounter: Payer: Self-pay | Admitting: Dermatology

## 2020-06-10 DIAGNOSIS — M9902 Segmental and somatic dysfunction of thoracic region: Secondary | ICD-10-CM | POA: Diagnosis not present

## 2020-06-10 DIAGNOSIS — M545 Low back pain, unspecified: Secondary | ICD-10-CM | POA: Diagnosis not present

## 2020-06-10 DIAGNOSIS — M6080 Other myositis, unspecified site: Secondary | ICD-10-CM | POA: Diagnosis not present

## 2020-06-10 DIAGNOSIS — M62830 Muscle spasm of back: Secondary | ICD-10-CM | POA: Diagnosis not present

## 2020-06-10 DIAGNOSIS — M9905 Segmental and somatic dysfunction of pelvic region: Secondary | ICD-10-CM | POA: Diagnosis not present

## 2020-06-10 DIAGNOSIS — M9903 Segmental and somatic dysfunction of lumbar region: Secondary | ICD-10-CM | POA: Diagnosis not present

## 2020-06-10 DIAGNOSIS — M546 Pain in thoracic spine: Secondary | ICD-10-CM | POA: Diagnosis not present

## 2020-06-10 DIAGNOSIS — M461 Sacroiliitis, not elsewhere classified: Secondary | ICD-10-CM | POA: Diagnosis not present

## 2020-06-18 ENCOUNTER — Other Ambulatory Visit: Payer: Self-pay | Admitting: Internal Medicine

## 2020-06-18 DIAGNOSIS — Z1331 Encounter for screening for depression: Secondary | ICD-10-CM | POA: Diagnosis not present

## 2020-06-18 DIAGNOSIS — J302 Other seasonal allergic rhinitis: Secondary | ICD-10-CM | POA: Diagnosis not present

## 2020-06-18 DIAGNOSIS — G4700 Insomnia, unspecified: Secondary | ICD-10-CM | POA: Diagnosis not present

## 2020-06-18 DIAGNOSIS — Z0000 Encounter for general adult medical examination without abnormal findings: Secondary | ICD-10-CM | POA: Diagnosis not present

## 2020-06-18 DIAGNOSIS — L709 Acne, unspecified: Secondary | ICD-10-CM | POA: Diagnosis not present

## 2020-06-19 DIAGNOSIS — Z0000 Encounter for general adult medical examination without abnormal findings: Secondary | ICD-10-CM | POA: Diagnosis not present

## 2020-06-19 DIAGNOSIS — L709 Acne, unspecified: Secondary | ICD-10-CM | POA: Diagnosis not present

## 2020-06-19 DIAGNOSIS — G4700 Insomnia, unspecified: Secondary | ICD-10-CM | POA: Diagnosis not present

## 2020-06-19 DIAGNOSIS — J302 Other seasonal allergic rhinitis: Secondary | ICD-10-CM | POA: Diagnosis not present

## 2020-07-22 DIAGNOSIS — M62830 Muscle spasm of back: Secondary | ICD-10-CM | POA: Diagnosis not present

## 2020-07-22 DIAGNOSIS — M546 Pain in thoracic spine: Secondary | ICD-10-CM | POA: Diagnosis not present

## 2020-07-22 DIAGNOSIS — M6080 Other myositis, unspecified site: Secondary | ICD-10-CM | POA: Diagnosis not present

## 2020-07-22 DIAGNOSIS — M545 Low back pain, unspecified: Secondary | ICD-10-CM | POA: Diagnosis not present

## 2020-07-22 DIAGNOSIS — M9905 Segmental and somatic dysfunction of pelvic region: Secondary | ICD-10-CM | POA: Diagnosis not present

## 2020-07-22 DIAGNOSIS — M461 Sacroiliitis, not elsewhere classified: Secondary | ICD-10-CM | POA: Diagnosis not present

## 2020-07-22 DIAGNOSIS — M9903 Segmental and somatic dysfunction of lumbar region: Secondary | ICD-10-CM | POA: Diagnosis not present

## 2020-07-22 DIAGNOSIS — M9902 Segmental and somatic dysfunction of thoracic region: Secondary | ICD-10-CM | POA: Diagnosis not present

## 2020-09-02 DIAGNOSIS — M6080 Other myositis, unspecified site: Secondary | ICD-10-CM | POA: Diagnosis not present

## 2020-09-02 DIAGNOSIS — M9903 Segmental and somatic dysfunction of lumbar region: Secondary | ICD-10-CM | POA: Diagnosis not present

## 2020-09-02 DIAGNOSIS — M545 Low back pain, unspecified: Secondary | ICD-10-CM | POA: Diagnosis not present

## 2020-09-02 DIAGNOSIS — M62830 Muscle spasm of back: Secondary | ICD-10-CM | POA: Diagnosis not present

## 2020-09-02 DIAGNOSIS — M9902 Segmental and somatic dysfunction of thoracic region: Secondary | ICD-10-CM | POA: Diagnosis not present

## 2020-09-02 DIAGNOSIS — M546 Pain in thoracic spine: Secondary | ICD-10-CM | POA: Diagnosis not present

## 2020-09-02 DIAGNOSIS — M461 Sacroiliitis, not elsewhere classified: Secondary | ICD-10-CM | POA: Diagnosis not present

## 2020-09-02 DIAGNOSIS — M9905 Segmental and somatic dysfunction of pelvic region: Secondary | ICD-10-CM | POA: Diagnosis not present

## 2020-10-22 ENCOUNTER — Ambulatory Visit (INDEPENDENT_AMBULATORY_CARE_PROVIDER_SITE_OTHER): Payer: 59 | Admitting: Dermatology

## 2020-10-22 ENCOUNTER — Other Ambulatory Visit: Payer: Self-pay

## 2020-10-22 DIAGNOSIS — L570 Actinic keratosis: Secondary | ICD-10-CM | POA: Diagnosis not present

## 2020-10-22 DIAGNOSIS — L820 Inflamed seborrheic keratosis: Secondary | ICD-10-CM | POA: Diagnosis not present

## 2020-10-22 DIAGNOSIS — L578 Other skin changes due to chronic exposure to nonionizing radiation: Secondary | ICD-10-CM

## 2020-10-22 NOTE — Patient Instructions (Addendum)

## 2020-10-22 NOTE — Progress Notes (Signed)
   Follow-Up Visit   Subjective  Kyle Lee is a 50 y.o. male who presents for the following: Skin Problem (Check a spot on the right nipple appeared about 1 month, started out as a tender spot, no longer tender but not going away ).  The following portions of the chart were reviewed this encounter and updated as appropriate:   Tobacco  Allergies  Meds  Problems  Med Hx  Surg Hx  Fam Hx      Review of Systems:  No other skin or systemic complaints except as noted in HPI or Assessment and Plan.  Objective  Well appearing patient in no apparent distress; mood and affect are within normal limits.  A focused examination was performed including face, neck, chest and back. Relevant physical exam findings are noted in the Assessment and Plan.  left side x 1, right nipple part of areola x 1, right areola x 1 (3) Erythematous keratotic or waxy stuck-on papule or plaque.   right anterior hairline x 1 Erythematous thin papules/macules with gritty scale.    Assessment & Plan  Inflamed seborrheic keratosis left side x 1, right nipple part of areola x 1, right areola x 1  Destruction of lesion - left side x 1, right nipple part of areola x 1, right areola x 1 Complexity: simple   Destruction method: cryotherapy   Informed consent: discussed and consent obtained   Timeout:  patient name, date of birth, surgical site, and procedure verified Lesion destroyed using liquid nitrogen: Yes   Region frozen until ice ball extended beyond lesion: Yes   Outcome: patient tolerated procedure well with no complications   Post-procedure details: wound care instructions given    AK (actinic keratosis) right anterior hairline x 1  Destruction of lesion - right anterior hairline x 1 Complexity: simple   Destruction method: cryotherapy   Informed consent: discussed and consent obtained   Timeout:  patient name, date of birth, surgical site, and procedure verified Lesion destroyed using  liquid nitrogen: Yes   Region frozen until ice ball extended beyond lesion: Yes   Outcome: patient tolerated procedure well with no complications   Post-procedure details: wound care instructions given    Actinic Damage - chronic, secondary to cumulative UV radiation exposure/sun exposure over time - diffuse scaly erythematous macules with underlying dyspigmentation - Recommend daily broad spectrum sunscreen SPF 30+ to sun-exposed areas, reapply every 2 hours as needed.  - Recommend staying in the shade or wearing long sleeves, sun glasses (UVA+UVB protection) and wide brim hats (4-inch brim around the entire circumference of the hat). - Call for new or changing lesions.  Return for as scheduled in Feb 2023 TBSE.  IMarye Round, CMA, am acting as scribe for Sarina Ser, MD .  Documentation: I have reviewed the above documentation for accuracy and completeness, and I agree with the above.  Sarina Ser, MD

## 2020-10-28 ENCOUNTER — Encounter: Payer: Self-pay | Admitting: Dermatology

## 2020-11-07 DIAGNOSIS — H40033 Anatomical narrow angle, bilateral: Secondary | ICD-10-CM | POA: Diagnosis not present

## 2020-11-07 DIAGNOSIS — H524 Presbyopia: Secondary | ICD-10-CM | POA: Diagnosis not present

## 2020-11-20 ENCOUNTER — Other Ambulatory Visit: Payer: Self-pay

## 2020-11-20 MED ORDER — TRAZODONE HCL 50 MG PO TABS
ORAL_TABLET | ORAL | 0 refills | Status: DC
Start: 2020-11-20 — End: 2023-08-11
  Filled 2020-11-20: qty 90, 90d supply, fill #0

## 2021-01-14 DIAGNOSIS — M461 Sacroiliitis, not elsewhere classified: Secondary | ICD-10-CM | POA: Diagnosis not present

## 2021-01-14 DIAGNOSIS — M546 Pain in thoracic spine: Secondary | ICD-10-CM | POA: Diagnosis not present

## 2021-01-14 DIAGNOSIS — M9903 Segmental and somatic dysfunction of lumbar region: Secondary | ICD-10-CM | POA: Diagnosis not present

## 2021-01-14 DIAGNOSIS — M6080 Other myositis, unspecified site: Secondary | ICD-10-CM | POA: Diagnosis not present

## 2021-01-14 DIAGNOSIS — M9905 Segmental and somatic dysfunction of pelvic region: Secondary | ICD-10-CM | POA: Diagnosis not present

## 2021-01-14 DIAGNOSIS — M545 Low back pain, unspecified: Secondary | ICD-10-CM | POA: Diagnosis not present

## 2021-01-14 DIAGNOSIS — M62830 Muscle spasm of back: Secondary | ICD-10-CM | POA: Diagnosis not present

## 2021-01-14 DIAGNOSIS — M9902 Segmental and somatic dysfunction of thoracic region: Secondary | ICD-10-CM | POA: Diagnosis not present

## 2021-04-22 DIAGNOSIS — M9905 Segmental and somatic dysfunction of pelvic region: Secondary | ICD-10-CM | POA: Diagnosis not present

## 2021-04-22 DIAGNOSIS — M545 Low back pain, unspecified: Secondary | ICD-10-CM | POA: Diagnosis not present

## 2021-04-22 DIAGNOSIS — M461 Sacroiliitis, not elsewhere classified: Secondary | ICD-10-CM | POA: Diagnosis not present

## 2021-04-22 DIAGNOSIS — M546 Pain in thoracic spine: Secondary | ICD-10-CM | POA: Diagnosis not present

## 2021-04-22 DIAGNOSIS — M9903 Segmental and somatic dysfunction of lumbar region: Secondary | ICD-10-CM | POA: Diagnosis not present

## 2021-04-22 DIAGNOSIS — M6080 Other myositis, unspecified site: Secondary | ICD-10-CM | POA: Diagnosis not present

## 2021-04-22 DIAGNOSIS — M9902 Segmental and somatic dysfunction of thoracic region: Secondary | ICD-10-CM | POA: Diagnosis not present

## 2021-04-22 DIAGNOSIS — M62830 Muscle spasm of back: Secondary | ICD-10-CM | POA: Diagnosis not present

## 2021-05-20 DIAGNOSIS — H40033 Anatomical narrow angle, bilateral: Secondary | ICD-10-CM | POA: Diagnosis not present

## 2021-06-17 ENCOUNTER — Other Ambulatory Visit: Payer: Self-pay

## 2021-06-17 ENCOUNTER — Ambulatory Visit (INDEPENDENT_AMBULATORY_CARE_PROVIDER_SITE_OTHER): Payer: 59 | Admitting: Dermatology

## 2021-06-17 DIAGNOSIS — D2271 Melanocytic nevi of right lower limb, including hip: Secondary | ICD-10-CM

## 2021-06-17 DIAGNOSIS — L814 Other melanin hyperpigmentation: Secondary | ICD-10-CM

## 2021-06-17 DIAGNOSIS — B079 Viral wart, unspecified: Secondary | ICD-10-CM

## 2021-06-17 DIAGNOSIS — L820 Inflamed seborrheic keratosis: Secondary | ICD-10-CM | POA: Diagnosis not present

## 2021-06-17 DIAGNOSIS — Z86018 Personal history of other benign neoplasm: Secondary | ICD-10-CM | POA: Diagnosis not present

## 2021-06-17 DIAGNOSIS — Q825 Congenital non-neoplastic nevus: Secondary | ICD-10-CM

## 2021-06-17 DIAGNOSIS — L578 Other skin changes due to chronic exposure to nonionizing radiation: Secondary | ICD-10-CM | POA: Diagnosis not present

## 2021-06-17 DIAGNOSIS — D1800 Hemangioma unspecified site: Secondary | ICD-10-CM

## 2021-06-17 DIAGNOSIS — Z85828 Personal history of other malignant neoplasm of skin: Secondary | ICD-10-CM | POA: Diagnosis not present

## 2021-06-17 DIAGNOSIS — Z1283 Encounter for screening for malignant neoplasm of skin: Secondary | ICD-10-CM | POA: Diagnosis not present

## 2021-06-17 DIAGNOSIS — L719 Rosacea, unspecified: Secondary | ICD-10-CM | POA: Diagnosis not present

## 2021-06-17 DIAGNOSIS — L821 Other seborrheic keratosis: Secondary | ICD-10-CM

## 2021-06-17 DIAGNOSIS — L111 Transient acantholytic dermatosis [Grover]: Secondary | ICD-10-CM

## 2021-06-17 DIAGNOSIS — D229 Melanocytic nevi, unspecified: Secondary | ICD-10-CM | POA: Diagnosis not present

## 2021-06-17 MED ORDER — CLINDAMYCIN PHOSPHATE 1 % EX LOTN
TOPICAL_LOTION | Freq: Every day | CUTANEOUS | 11 refills | Status: DC
  Filled 2021-06-17: qty 60, 30d supply, fill #0
  Filled 2021-07-06: qty 60, 20d supply, fill #0

## 2021-06-17 NOTE — Progress Notes (Signed)
Follow-Up Visit   Subjective  Kyle Lee is a 51 y.o. male who presents for the following: Total body skin exam (Hx of BCCs, hx of Dysplastic nevi, hx of AKs) and check spot (Scalp/partline, 1wk, red spot). The patient presents for Total-Body Skin Exam (TBSE) for skin cancer screening and mole check.  The patient has spots, moles and lesions to be evaluated, some may be new or changing and the patient has concerns that these could be cancer.   The following portions of the chart were reviewed this encounter and updated as appropriate:   Tobacco   Allergies   Meds   Problems   Med Hx   Surg Hx   Fam Hx      Review of Systems:  No other skin or systemic complaints except as noted in HPI or Assessment and Plan.  Objective  Well appearing patient in no apparent distress; mood and affect are within normal limits.  A full examination was performed including scalp, head, eyes, ears, nose, lips, neck, chest, axillae, abdomen, back, buttocks, bilateral upper extremities, bilateral lower extremities, hands, feet, fingers, toes, fingernails, and toenails. All findings within normal limits unless otherwise noted below.  trunk, extremities Grover's rash improved today  R medial cheek, left post deltoid Well healed scars with no evidence of recurrence.   face Erythema face, telangiectasias cheeks, nose  R scalp x 1 Stuck on waxy paps with erythema   L pubic area Mamillated pap  R ant sole x 2 (2) Verrucous papules -- Discussed viral etiology and contagion.   R 3rd toe 0.2cm brown macule      Assessment & Plan   History of Dysplastic Nevi - No evidence of recurrence today - Recommend regular full body skin exams - Recommend daily broad spectrum sunscreen SPF 30+ to sun-exposed areas, reapply every 2 hours as needed.  - Call if any new or changing lesions are noted between office visits   Lentigines - Scattered tan macules - Due to sun exposure - Benign-appearing,  observe - Recommend daily broad spectrum sunscreen SPF 30+ to sun-exposed areas, reapply every 2 hours as needed. - Call for any changes  Seborrheic Keratoses - Stuck-on, waxy, tan-brown papules and/or plaques  - Benign-appearing - Discussed benign etiology and prognosis. - Observe - Call for any changes  Melanocytic Nevi - Tan-brown and/or pink-flesh-colored symmetric macules and papules - Benign appearing on exam today - Observation - Call clinic for new or changing moles - Recommend daily use of broad spectrum spf 30+ sunscreen to sun-exposed areas.   Hemangiomas - Red papules - Discussed benign nature - Observe - Call for any changes  Actinic Damage - Chronic condition, secondary to cumulative UV/sun exposure - diffuse scaly erythematous macules with underlying dyspigmentation - Recommend daily broad spectrum sunscreen SPF 30+ to sun-exposed areas, reapply every 2 hours as needed.  - Staying in the shade or wearing long sleeves, sun glasses (UVA+UVB protection) and wide brim hats (4-inch brim around the entire circumference of the hat) are also recommended for sun protection.  - Call for new or changing lesions.  Skin cancer screening performed today.  Grover's disease trunk, Chronic and persistent condition, currently improved Grover's Disease (or Transient Acantholytic Dermatosis) is an acquired disease of unknown etiology that has been linked to heat, sweating, excessive sun exposure, ionizing radiation, and some medications including immunotherapies such as BRAF inhibitors, sulfadoxine-pyrimethamine, recombinant IL-4, ipilimumab, and other immune checkpoint inhibitors.  It more commonly presents in the winter  and can be associated with atopic dermatitis, renal failure, transplantation and malignancies. It is very difficult to treat and can be persistent and recurrent but topical steroids, calcipotriene cream, antihistamines can be helpful.  For recalcitrant disease, other  treatments have been used such as isotretinoin, acitretin, systemic steroids, and Dupixent.  Cont Clindamycin lotion qd prn flares  clindamycin (CLEOCIN-T) 1 % lotion - trunk, extremities Apply topically daily. Qd to aa rash until clear  History of basal cell carcinoma (BCC) R medial cheek, left post deltoid Clear. Observe for recurrence. Call clinic for new or changing lesions.  Recommend regular skin exams, daily broad-spectrum spf 30+ sunscreen use, and photoprotection.    Rosacea face Rosacea is a chronic progressive skin condition usually affecting the face of adults, causing redness and/or acne bumps. It is treatable but not curable. It sometimes affects the eyes (ocular rosacea) as well. It may respond to topical and/or systemic medication and can flare with stress, sun exposure, alcohol, exercise and some foods.  Daily application of broad spectrum spf 30+ sunscreen to face is recommended to reduce flares.  Discussed BBL, $350 per treatment session  Inflamed seborrheic keratosis R scalp x 1 Destruction of lesion - R scalp x 1 Complexity: simple   Destruction method: cryotherapy   Informed consent: discussed and consent obtained   Timeout:  patient name, date of birth, surgical site, and procedure verified Lesion destroyed using liquid nitrogen: Yes   Region frozen until ice ball extended beyond lesion: Yes   Outcome: patient tolerated procedure well with no complications   Post-procedure details: wound care instructions given    Congenital non-neoplastic nevus L pubic area Flesh colored mammillated papule. Benign-appearing.  Observation.  Call clinic for new or changing lesions.  Recommend daily use of broad spectrum spf 30+ sunscreen to sun-exposed areas.   Viral warts, unspecified type (2) R ant sole x 2  Discussed viral etiology and risk of spread.  Discussed multiple treatments may be required to clear warts.  Discussed possible post-treatment dyspigmentation and risk  of recurrence.  Destruction of lesion - R ant sole x 2 Complexity: simple   Destruction method: cryotherapy   Informed consent: discussed and consent obtained   Timeout:  patient name, date of birth, surgical site, and procedure verified Lesion destroyed using liquid nitrogen: Yes   Region frozen until ice ball extended beyond lesion: Yes   Outcome: patient tolerated procedure well with no complications   Post-procedure details: wound care instructions given    Nevus R 3rd toe See photo Benign-appearing.  Observation.  Call clinic for new or changing lesions.  Recommend daily use of broad spectrum spf 30+ sunscreen to sun-exposed areas.    Skin cancer screening  Return in about 1 year (around 06/17/2022) for TBSE, Hx of BCC, Hx of Dysplastic nevi, AK f/u.  I, Othelia Pulling, RMA, am acting as scribe for Sarina Ser, MD . Documentation: I have reviewed the above documentation for accuracy and completeness, and I agree with the above.  Sarina Ser, MD

## 2021-06-17 NOTE — Patient Instructions (Signed)

## 2021-06-21 ENCOUNTER — Encounter: Payer: Self-pay | Admitting: Dermatology

## 2021-06-29 ENCOUNTER — Other Ambulatory Visit: Payer: Self-pay

## 2021-07-01 ENCOUNTER — Other Ambulatory Visit: Payer: Self-pay

## 2021-07-01 DIAGNOSIS — Z1331 Encounter for screening for depression: Secondary | ICD-10-CM | POA: Diagnosis not present

## 2021-07-01 DIAGNOSIS — G4709 Other insomnia: Secondary | ICD-10-CM | POA: Diagnosis not present

## 2021-07-01 DIAGNOSIS — Z0000 Encounter for general adult medical examination without abnormal findings: Secondary | ICD-10-CM | POA: Diagnosis not present

## 2021-07-01 MED ORDER — SCOPOLAMINE 1 MG/3DAYS TD PT72
MEDICATED_PATCH | TRANSDERMAL | 1 refills | Status: DC
  Filled 2021-07-01: qty 10, 30d supply, fill #0

## 2021-07-01 MED ORDER — TRAZODONE HCL 50 MG PO TABS
ORAL_TABLET | ORAL | 1 refills | Status: AC
  Filled 2021-07-01: qty 90, 90d supply, fill #0

## 2021-07-02 DIAGNOSIS — G4709 Other insomnia: Secondary | ICD-10-CM | POA: Diagnosis not present

## 2021-07-02 DIAGNOSIS — Z0000 Encounter for general adult medical examination without abnormal findings: Secondary | ICD-10-CM | POA: Diagnosis not present

## 2021-07-06 ENCOUNTER — Other Ambulatory Visit: Payer: Self-pay

## 2021-09-01 DIAGNOSIS — M62830 Muscle spasm of back: Secondary | ICD-10-CM | POA: Diagnosis not present

## 2021-09-01 DIAGNOSIS — M461 Sacroiliitis, not elsewhere classified: Secondary | ICD-10-CM | POA: Diagnosis not present

## 2021-09-01 DIAGNOSIS — M9905 Segmental and somatic dysfunction of pelvic region: Secondary | ICD-10-CM | POA: Diagnosis not present

## 2021-09-01 DIAGNOSIS — M6080 Other myositis, unspecified site: Secondary | ICD-10-CM | POA: Diagnosis not present

## 2021-09-01 DIAGNOSIS — M9902 Segmental and somatic dysfunction of thoracic region: Secondary | ICD-10-CM | POA: Diagnosis not present

## 2021-09-01 DIAGNOSIS — M545 Low back pain, unspecified: Secondary | ICD-10-CM | POA: Diagnosis not present

## 2021-09-01 DIAGNOSIS — M9903 Segmental and somatic dysfunction of lumbar region: Secondary | ICD-10-CM | POA: Diagnosis not present

## 2021-09-01 DIAGNOSIS — M546 Pain in thoracic spine: Secondary | ICD-10-CM | POA: Diagnosis not present

## 2021-09-15 DIAGNOSIS — M62830 Muscle spasm of back: Secondary | ICD-10-CM | POA: Diagnosis not present

## 2021-09-15 DIAGNOSIS — M545 Low back pain, unspecified: Secondary | ICD-10-CM | POA: Diagnosis not present

## 2021-09-15 DIAGNOSIS — M9902 Segmental and somatic dysfunction of thoracic region: Secondary | ICD-10-CM | POA: Diagnosis not present

## 2021-09-15 DIAGNOSIS — M9903 Segmental and somatic dysfunction of lumbar region: Secondary | ICD-10-CM | POA: Diagnosis not present

## 2021-09-15 DIAGNOSIS — M6080 Other myositis, unspecified site: Secondary | ICD-10-CM | POA: Diagnosis not present

## 2021-09-15 DIAGNOSIS — M9905 Segmental and somatic dysfunction of pelvic region: Secondary | ICD-10-CM | POA: Diagnosis not present

## 2021-09-15 DIAGNOSIS — M461 Sacroiliitis, not elsewhere classified: Secondary | ICD-10-CM | POA: Diagnosis not present

## 2021-09-15 DIAGNOSIS — M546 Pain in thoracic spine: Secondary | ICD-10-CM | POA: Diagnosis not present

## 2021-11-18 DIAGNOSIS — H524 Presbyopia: Secondary | ICD-10-CM | POA: Diagnosis not present

## 2021-11-18 DIAGNOSIS — H40033 Anatomical narrow angle, bilateral: Secondary | ICD-10-CM | POA: Diagnosis not present

## 2021-11-18 DIAGNOSIS — H25813 Combined forms of age-related cataract, bilateral: Secondary | ICD-10-CM | POA: Diagnosis not present

## 2021-12-18 DIAGNOSIS — H40033 Anatomical narrow angle, bilateral: Secondary | ICD-10-CM | POA: Diagnosis not present

## 2021-12-30 DIAGNOSIS — Z23 Encounter for immunization: Secondary | ICD-10-CM | POA: Diagnosis not present

## 2022-01-28 DIAGNOSIS — M6080 Other myositis, unspecified site: Secondary | ICD-10-CM | POA: Diagnosis not present

## 2022-01-28 DIAGNOSIS — M62830 Muscle spasm of back: Secondary | ICD-10-CM | POA: Diagnosis not present

## 2022-01-28 DIAGNOSIS — M545 Low back pain, unspecified: Secondary | ICD-10-CM | POA: Diagnosis not present

## 2022-01-28 DIAGNOSIS — M9905 Segmental and somatic dysfunction of pelvic region: Secondary | ICD-10-CM | POA: Diagnosis not present

## 2022-01-28 DIAGNOSIS — M9903 Segmental and somatic dysfunction of lumbar region: Secondary | ICD-10-CM | POA: Diagnosis not present

## 2022-01-28 DIAGNOSIS — M461 Sacroiliitis, not elsewhere classified: Secondary | ICD-10-CM | POA: Diagnosis not present

## 2022-01-28 DIAGNOSIS — M546 Pain in thoracic spine: Secondary | ICD-10-CM | POA: Diagnosis not present

## 2022-01-28 DIAGNOSIS — M9902 Segmental and somatic dysfunction of thoracic region: Secondary | ICD-10-CM | POA: Diagnosis not present

## 2022-02-24 ENCOUNTER — Other Ambulatory Visit: Payer: Self-pay

## 2022-02-24 DIAGNOSIS — M25521 Pain in right elbow: Secondary | ICD-10-CM | POA: Diagnosis not present

## 2022-02-24 DIAGNOSIS — S46211A Strain of muscle, fascia and tendon of other parts of biceps, right arm, initial encounter: Secondary | ICD-10-CM | POA: Diagnosis not present

## 2022-02-24 MED ORDER — MELOXICAM 7.5 MG PO TABS
ORAL_TABLET | ORAL | 0 refills | Status: DC
  Filled 2022-02-24: qty 60, 30d supply, fill #0

## 2022-03-11 DIAGNOSIS — H40033 Anatomical narrow angle, bilateral: Secondary | ICD-10-CM | POA: Diagnosis not present

## 2022-03-12 ENCOUNTER — Other Ambulatory Visit: Payer: Self-pay

## 2022-03-12 DIAGNOSIS — R42 Dizziness and giddiness: Secondary | ICD-10-CM | POA: Diagnosis not present

## 2022-03-12 DIAGNOSIS — R519 Headache, unspecified: Secondary | ICD-10-CM | POA: Diagnosis not present

## 2022-03-12 MED ORDER — MECLIZINE HCL 25 MG PO TABS
ORAL_TABLET | ORAL | 2 refills | Status: DC
  Filled 2022-03-12: qty 30, 10d supply, fill #0

## 2022-03-18 DIAGNOSIS — M546 Pain in thoracic spine: Secondary | ICD-10-CM | POA: Diagnosis not present

## 2022-03-18 DIAGNOSIS — M9902 Segmental and somatic dysfunction of thoracic region: Secondary | ICD-10-CM | POA: Diagnosis not present

## 2022-03-18 DIAGNOSIS — M9903 Segmental and somatic dysfunction of lumbar region: Secondary | ICD-10-CM | POA: Diagnosis not present

## 2022-03-18 DIAGNOSIS — M545 Low back pain, unspecified: Secondary | ICD-10-CM | POA: Diagnosis not present

## 2022-03-18 DIAGNOSIS — M461 Sacroiliitis, not elsewhere classified: Secondary | ICD-10-CM | POA: Diagnosis not present

## 2022-03-18 DIAGNOSIS — M9905 Segmental and somatic dysfunction of pelvic region: Secondary | ICD-10-CM | POA: Diagnosis not present

## 2022-03-18 DIAGNOSIS — M6080 Other myositis, unspecified site: Secondary | ICD-10-CM | POA: Diagnosis not present

## 2022-03-18 DIAGNOSIS — M62830 Muscle spasm of back: Secondary | ICD-10-CM | POA: Diagnosis not present

## 2022-03-23 ENCOUNTER — Other Ambulatory Visit: Payer: Self-pay | Admitting: Internal Medicine

## 2022-03-23 DIAGNOSIS — R519 Headache, unspecified: Secondary | ICD-10-CM

## 2022-03-23 DIAGNOSIS — R42 Dizziness and giddiness: Secondary | ICD-10-CM

## 2022-03-24 DIAGNOSIS — H8112 Benign paroxysmal vertigo, left ear: Secondary | ICD-10-CM | POA: Diagnosis not present

## 2022-03-25 DIAGNOSIS — M62830 Muscle spasm of back: Secondary | ICD-10-CM | POA: Diagnosis not present

## 2022-03-25 DIAGNOSIS — M545 Low back pain, unspecified: Secondary | ICD-10-CM | POA: Diagnosis not present

## 2022-03-25 DIAGNOSIS — M9903 Segmental and somatic dysfunction of lumbar region: Secondary | ICD-10-CM | POA: Diagnosis not present

## 2022-03-25 DIAGNOSIS — M9902 Segmental and somatic dysfunction of thoracic region: Secondary | ICD-10-CM | POA: Diagnosis not present

## 2022-03-25 DIAGNOSIS — M546 Pain in thoracic spine: Secondary | ICD-10-CM | POA: Diagnosis not present

## 2022-03-25 DIAGNOSIS — M461 Sacroiliitis, not elsewhere classified: Secondary | ICD-10-CM | POA: Diagnosis not present

## 2022-03-25 DIAGNOSIS — M6080 Other myositis, unspecified site: Secondary | ICD-10-CM | POA: Diagnosis not present

## 2022-03-25 DIAGNOSIS — M9905 Segmental and somatic dysfunction of pelvic region: Secondary | ICD-10-CM | POA: Diagnosis not present

## 2022-04-08 DIAGNOSIS — R42 Dizziness and giddiness: Secondary | ICD-10-CM | POA: Diagnosis not present

## 2022-04-27 ENCOUNTER — Ambulatory Visit
Admission: RE | Admit: 2022-04-27 | Discharge: 2022-04-27 | Disposition: A | Discharge status: Home / Self Care | Payer: 59 | Source: Ambulatory Visit | Attending: Internal Medicine | Admitting: Internal Medicine

## 2022-04-27 DIAGNOSIS — R42 Dizziness and giddiness: Secondary | ICD-10-CM | POA: Insufficient documentation

## 2022-04-27 DIAGNOSIS — R519 Headache, unspecified: Secondary | ICD-10-CM | POA: Insufficient documentation

## 2022-06-16 ENCOUNTER — Ambulatory Visit (INDEPENDENT_AMBULATORY_CARE_PROVIDER_SITE_OTHER): Payer: 59 | Admitting: Dermatology

## 2022-06-16 ENCOUNTER — Encounter: Payer: Self-pay | Admitting: Dermatology

## 2022-06-16 ENCOUNTER — Other Ambulatory Visit: Payer: Self-pay

## 2022-06-16 VITALS — BP 111/81 | HR 83

## 2022-06-16 DIAGNOSIS — Z86018 Personal history of other benign neoplasm: Secondary | ICD-10-CM | POA: Diagnosis not present

## 2022-06-16 DIAGNOSIS — Z85828 Personal history of other malignant neoplasm of skin: Secondary | ICD-10-CM | POA: Diagnosis not present

## 2022-06-16 DIAGNOSIS — L820 Inflamed seborrheic keratosis: Secondary | ICD-10-CM

## 2022-06-16 DIAGNOSIS — L814 Other melanin hyperpigmentation: Secondary | ICD-10-CM | POA: Diagnosis not present

## 2022-06-16 DIAGNOSIS — L111 Transient acantholytic dermatosis [Grover]: Secondary | ICD-10-CM | POA: Diagnosis not present

## 2022-06-16 DIAGNOSIS — B078 Other viral warts: Secondary | ICD-10-CM

## 2022-06-16 DIAGNOSIS — L738 Other specified follicular disorders: Secondary | ICD-10-CM

## 2022-06-16 DIAGNOSIS — Z79899 Other long term (current) drug therapy: Secondary | ICD-10-CM

## 2022-06-16 DIAGNOSIS — Z1283 Encounter for screening for malignant neoplasm of skin: Secondary | ICD-10-CM

## 2022-06-16 DIAGNOSIS — D229 Melanocytic nevi, unspecified: Secondary | ICD-10-CM

## 2022-06-16 DIAGNOSIS — Z7189 Other specified counseling: Secondary | ICD-10-CM

## 2022-06-16 DIAGNOSIS — D1801 Hemangioma of skin and subcutaneous tissue: Secondary | ICD-10-CM

## 2022-06-16 DIAGNOSIS — L821 Other seborrheic keratosis: Secondary | ICD-10-CM | POA: Diagnosis not present

## 2022-06-16 DIAGNOSIS — Q825 Congenital non-neoplastic nevus: Secondary | ICD-10-CM

## 2022-06-16 DIAGNOSIS — L578 Other skin changes due to chronic exposure to nonionizing radiation: Secondary | ICD-10-CM

## 2022-06-16 DIAGNOSIS — L818 Other specified disorders of pigmentation: Secondary | ICD-10-CM | POA: Diagnosis not present

## 2022-06-16 MED ORDER — CLINDAMYCIN PHOSPHATE 1 % EX LOTN
1.0000 | TOPICAL_LOTION | Freq: Every day | CUTANEOUS | 11 refills | Status: AC
  Filled 2022-06-16: qty 60, 30d supply, fill #0

## 2022-06-16 NOTE — Progress Notes (Signed)
Follow-Up Visit   Subjective  Kyle Lee is a 52 y.o. male who presents for the following: Total body skin exam (Hx of BCCs, Hx of Dysplastic nevi) and check spot (R index finger, pt txted at home with wart freeze off/R index finger, had a cut and skin thick/R index finger, rough patch of skin/Mole L leg sensitive/New spot on forehead, L temple). The patient presents for Total-Body Skin Exam (TBSE) for skin cancer screening and mole check.  The patient has spots, moles and lesions to be evaluated, some may be new or changing and the patient has concerns that these could be cancer.   The following portions of the chart were reviewed this encounter and updated as appropriate:   Tobacco  Allergies  Meds  Problems  Med Hx  Surg Hx  Fam Hx     Review of Systems:  No other skin or systemic complaints except as noted in HPI or Assessment and Plan.  Objective  Well appearing patient in no apparent distress; mood and affect are within normal limits.  A full examination was performed including scalp, head, eyes, ears, nose, lips, neck, chest, axillae, abdomen, back, buttocks, bilateral upper extremities, bilateral lower extremities, hands, feet, fingers, toes, fingernails, and toenails. All findings within normal limits unless otherwise noted below.  Left Breast Trunk clear today  forehead Small yellow papules with a central dell  L temple x 1, L potliteal x 1 Stuck on waxy paps with erythema  L glabella Graphite tattoo  posterior neck Vascular patch  R index finger x 2, R ant sole x 1 (3) Verrucous papules -- Discussed viral etiology and contagion.    Assessment & Plan   Lentigines - Scattered tan macules - Due to sun exposure - Benign-appearing, observe - Recommend daily broad spectrum sunscreen SPF 30+ to sun-exposed areas, reapply every 2 hours as needed. - Call for any changes - back  Seborrheic Keratoses - Stuck-on, waxy, tan-brown papules and/or plaques   - Benign-appearing - Discussed benign etiology and prognosis. - Observe - Call for any changes - trunk, legs  Melanocytic Nevi - Tan-brown and/or pink-flesh-colored symmetric macules and papules - Benign appearing on exam today - Observation - Call clinic for new or changing moles - Recommend daily use of broad spectrum spf 30+ sunscreen to sun-exposed areas.  - trunk  Hemangiomas - Red papules - Discussed benign nature - Observe - Call for any changes - trunk  Actinic Damage - Chronic condition, secondary to cumulative UV/sun exposure - diffuse scaly erythematous macules with underlying dyspigmentation - Recommend daily broad spectrum sunscreen SPF 30+ to sun-exposed areas, reapply every 2 hours as needed.  - Staying in the shade or wearing long sleeves, sun glasses (UVA+UVB protection) and wide brim hats (4-inch brim around the entire circumference of the hat) are also recommended for sun protection.  - Call for new or changing lesions.  Skin cancer screening performed today.   History of Basal Cell Carcinoma of the Skin - No evidence of recurrence today - Recommend regular full body skin exams - Recommend daily broad spectrum sunscreen SPF 30+ to sun-exposed areas, reapply every 2 hours as needed.  - Call if any new or changing lesions are noted between office visits  - R medial cheek, L post deltoid  History of Dysplastic Nevi - No evidence of recurrence today - Recommend regular full body skin exams - Recommend daily broad spectrum sunscreen SPF 30+ to sun-exposed areas, reapply every 2 hours as needed.  -  Call if any new or changing lesions are noted between office visits  - R proximal posterior lat thigh  Grover's disease Chest Grover's Disease (or Transient Acantholytic Dermatosis) is an acquired chronic; persistent and recurrent disease of unknown etiology that has been linked to heat, sweating, excessive sun exposure, ionizing radiation, and some medications  including immunotherapies such as BRAF inhibitors, sulfadoxine-pyrimethamine, recombinant IL-4, ipilimumab, and other immune checkpoint inhibitors.  It more commonly presents in the winter and can be associated with atopic dermatitis, renal failure, transplantation and malignancies. It is very difficult to treat and can be persistent and recurrent but topical steroids, calcipotriene cream, antihistamines can be helpful.  For recalcitrant disease, other treatments have been used such as isotretinoin, acitretin, systemic steroids, and Dupixent.   Cont Clindamycin lotion qd prn  Related Medications clindamycin (CLEOCIN-T) 1 % lotion Apply to affected area/rash daily until clear  Sebaceous hyperplasia forehead Benign-appearing.  Observation.  Call clinic for new or changing lesions.  Recommend daily use of broad spectrum spf 30+ sunscreen to sun-exposed areas.   Inflamed seborrheic keratosis L temple x 1, L potliteal x 1 Symptomatic, irritating, patient would like treated. Destruction of lesion - L temple x 1, L potliteal x 1 Complexity: simple   Destruction method: cryotherapy   Informed consent: discussed and consent obtained   Timeout:  patient name, date of birth, surgical site, and procedure verified Lesion destroyed using liquid nitrogen: Yes   Region frozen until ice ball extended beyond lesion: Yes   Outcome: patient tolerated procedure well with no complications   Post-procedure details: wound care instructions given    Graphite Tattoo From pencil injury in past L glabella Benign-appearing.  Observation.  Call clinic for new or changing lesions.  Recommend daily use of broad spectrum spf 30+ sunscreen to sun-exposed areas.   Vascular birthmark posterior neck Benign-appearing.  Observation.  Call clinic for new or changing moles.  Recommend daily use of broad spectrum spf 30+ sunscreen to sun-exposed areas.    Other viral warts (3) R index finger x 2, R ant sole x 1 Viral  Wart (HPV) Counseling  Discussed viral / HPV (Human Papilloma Virus) etiology and risk of spread /infectivity to other areas of body as well as to other people.  Multiple treatments and methods may be required to clear warts and it is possible treatment may not be successful.  Treatment risks include discoloration; scarring and there is still potential for wart recurrence.  Destruction of lesion - R index finger x 2, R ant sole x 1 Complexity: simple   Destruction method: cryotherapy   Informed consent: discussed and consent obtained   Timeout:  patient name, date of birth, surgical site, and procedure verified Lesion destroyed using liquid nitrogen: Yes   Region frozen until ice ball extended beyond lesion: Yes   Outcome: patient tolerated procedure well with no complications   Post-procedure details: wound care instructions given    Return in about 1 year (around 06/17/2023) for TBSE, Hx of BCC, Hx of Dysplastic nevi.  I, Othelia Pulling, RMA, am acting as scribe for Sarina Ser, MD . Documentation: I have reviewed the above documentation for accuracy and completeness, and I agree with the above.  Sarina Ser, MD

## 2022-06-16 NOTE — Patient Instructions (Addendum)
Viral Wart (HPV) Counseling  Discussed viral / HPV (Human Papilloma Virus) etiology and risk of spread /infectivity to other areas of body as well as to other people.  Multiple treatments and methods may be required to clear warts and it is possible treatment may not be successful.  Treatment risks include discoloration; scarring and there is still potential for wart recurrence.   Cryotherapy Aftercare  Wash gently with soap and water everyday.   Apply Vaseline and Band-Aid daily until healed.   Due to recent changes in healthcare laws, you may see results of your pathology and/or laboratory studies on MyChart before the doctors have had a chance to review them. We understand that in some cases there may be results that are confusing or concerning to you. Please understand that not all results are received at the same time and often the doctors may need to interpret multiple results in order to provide you with the best plan of care or course of treatment. Therefore, we ask that you please give [place] 2 business days to thoroughly review all your results before contacting the office for clarification. Should we see a critical lab result, you will be contacted sooner.   If You Need Anything After Your Visit  If you have any questions or concerns for your doctor, please call our main line at [phone] and press option 4 to reach your doctor's medical assistant. If no one answers, please leave a voicemail as directed and we will return your call as soon as possible. Messages left after 4 pm will be answered the following business day.   You may also send [place] a message via [place]. We typically respond to MyChart messages within 1-2 business days.  For prescription refills, please ask your pharmacy to contact our office. Our fax number is [phone].  If you have an urgent issue when the clinic is closed that cannot wait until the next business day, you can page your doctor at the number below.     Please note that while we do our best to be available for urgent issues outside of office hours, we are not available 24/7.   If you have an urgent issue and are unable to reach [place], you may choose to seek medical care at your doctor's office, retail clinic, urgent care center, or emergency room.  If you have a medical emergency, please immediately call 911 or go to the emergency department.  Pager Numbers  - Dr. Nehemiah Massed: [phone]  - Dr. Laurence Ferrari: [phone]  - Dr. Nicole Kindred: [phone]  In the event of inclement weather, please call our main line at [phone] for an update on the status of any delays or closures.  Dermatology Medication Tips: Please keep the boxes that topical medications come in in order to help keep track of the instructions about where and how to use these. Pharmacies typically print the medication instructions only on the boxes and not directly on the medication tubes.   If your medication is too expensive, please contact our office at [phone] option 4 or send [place] a message through [place].   We are unable to tell what your co-pay for medications will be in advance as this is different depending on your insurance coverage. However, we may be able to find a substitute medication at lower cost or fill out paperwork to get insurance to cover a needed medication.   If a prior authorization is required to get your medication covered by your insurance company, please allow [place] 1-2  business days to complete this process.  Drug prices often vary depending on where the prescription is filled and some pharmacies may offer cheaper prices.  The website www.goodrx.com contains coupons for medications through different pharmacies. The prices here do not account for what the cost may be with help from insurance (it may be cheaper with your insurance), but the website can give you the price if you did not use any insurance.  - You can print the associated coupon and take  it with your prescription to the pharmacy.  - You may also stop by our office during regular business hours and pick up a GoodRx coupon card.  - If you need your prescription sent electronically to a different pharmacy, notify our office through [facility] or by phone at [phone] option 4.     Si Usted Necesita Algo Despus de Su Visita  Tambin puede enviarnos un mensaje a travs de Pharmacist, community. Por lo general respondemos a los mensajes de MyChart en el transcurso de 1 a 2 das hbiles.  Para renovar recetas, por favor pida a su farmacia que se ponga en contacto con nuestra oficina. Harland Dingwall de fax es [place] [phone].  Si tiene un asunto urgente cuando la clnica est cerrada y que no puede esperar hasta el siguiente da hbil, puede llamar/localizar a su doctor(a) al nmero que aparece a continuacin.   Por favor, tenga en cuenta que aunque hacemos todo lo posible para estar disponibles para asuntos urgentes fuera del horario de [place], no estamos disponibles las 24 horas del da, los 7 das de la [place].   Si tiene un problema urgente y no puede comunicarse con nosotros, puede optar por buscar atencin mdica  en el consultorio de su doctor(a), en una clnica privada, en un centro de atencin urgente o en una sala de emergencias.  Si tiene Engineering geologist, por favor llame inmediatamente al 911 o vaya a la sala de emergencias.  Nmeros de bper  - Dr. Nehemiah Massed: [phone]  - Dra. Moye: [phone]  - Dra. Nicole Kindred: [phone]  En caso de inclemencias del [place], por favor llame a Johnsie Kindred principal al [phone] para una actualizacin sobre el [place] de cualquier retraso o cierre.  Consejos para la medicacin en dermatologa: Por favor, guarde las cajas en las que vienen los medicamentos de uso tpico para ayudarle a seguir las instrucciones sobre dnde y cmo usarlos. Las farmacias generalmente imprimen las instrucciones del medicamento slo en las  cajas y no directamente en los tubos del [place].   Si su medicamento es muy caro, por favor, pngase en contacto con Zigmund Daniel llamando al [phone] y presione la opcin 4 o envenos un mensaje a travs de Pharmacist, community.   No podemos decirle cul ser su copago por los medicamentos por adelantado ya que esto es diferente dependiendo de la cobertura de su seguro. Sin embargo, es posible que podamos encontrar un medicamento sustituto a Electrical engineer un formulario para que el seguro cubra el medicamento que se considera necesario.   Si se requiere una autorizacin previa para que su compaa de seguros [place] su medicamento, por favor permtanos de 1 a 2 das hbiles para completar este proceso.  Los precios de los medicamentos varan con frecuencia dependiendo del Environmental consultant de dnde se surte la receta y alguna farmacias pueden ofrecer precios ms baratos.  El sitio web www.goodrx.com tiene cupones para medicamentos de Airline pilot. Los precios aqu no tienen en cuenta lo que podra costar con  la ayuda del seguro (puede ser ms barato con su seguro), pero el sitio web puede darle el precio si no Field seismologist.  - Puede imprimir el cupn correspondiente y llevarlo con su receta a la farmacia.  - Tambin puede pasar por nuestra oficina durante el horario de atencin regular y Charity fundraiser una tarjeta de cupones de GoodRx.  - Si necesita que su receta se enve electrnicamente a una farmacia diferente, informe a nuestra oficina a travs de MyChart de [place] o por telfono llamando al [phone] y presione la opcin 4.

## 2022-06-16 NOTE — Progress Notes (Deleted)
   Follow-Up Visit   Subjective  Kyle Lee is a 52 y.o. male who presents for the following: Annual Exam.  ***  The following portions of the chart were reviewed this encounter and updated as appropriate:       Review of Systems:  No other skin or systemic complaints except as noted in HPI or Assessment and Plan.  Objective  Well appearing patient in no apparent distress; mood and affect are within normal limits.  {[id number]::"B full examination was performed including scalp, head, eyes, ears, nose, lips, neck, chest, axillae, abdomen, back, buttocks, bilateral upper extremities, bilateral lower extremities, hands, feet, fingers, toes, fingernails, and toenails. All findings within normal limits unless otherwise noted below."}    Assessment & Plan   No follow-ups on file.

## 2022-06-28 ENCOUNTER — Other Ambulatory Visit: Payer: Self-pay

## 2022-07-02 DIAGNOSIS — G4709 Other insomnia: Secondary | ICD-10-CM | POA: Diagnosis not present

## 2022-07-02 DIAGNOSIS — Z0000 Encounter for general adult medical examination without abnormal findings: Secondary | ICD-10-CM | POA: Diagnosis not present

## 2022-07-02 DIAGNOSIS — Z1331 Encounter for screening for depression: Secondary | ICD-10-CM | POA: Diagnosis not present

## 2022-07-20 DIAGNOSIS — Z0000 Encounter for general adult medical examination without abnormal findings: Secondary | ICD-10-CM | POA: Diagnosis not present

## 2022-07-20 DIAGNOSIS — G4709 Other insomnia: Secondary | ICD-10-CM | POA: Diagnosis not present

## 2022-10-11 DIAGNOSIS — Z23 Encounter for immunization: Secondary | ICD-10-CM | POA: Diagnosis not present

## 2022-12-20 DIAGNOSIS — H25813 Combined forms of age-related cataract, bilateral: Secondary | ICD-10-CM | POA: Diagnosis not present

## 2023-01-17 DIAGNOSIS — H524 Presbyopia: Secondary | ICD-10-CM | POA: Diagnosis not present

## 2023-02-15 ENCOUNTER — Ambulatory Visit (INDEPENDENT_AMBULATORY_CARE_PROVIDER_SITE_OTHER): Payer: 59 | Admitting: Dermatology

## 2023-02-15 ENCOUNTER — Encounter: Payer: Self-pay | Admitting: Dermatology

## 2023-02-15 VITALS — BP 111/77

## 2023-02-15 DIAGNOSIS — W908XXA Exposure to other nonionizing radiation, initial encounter: Secondary | ICD-10-CM

## 2023-02-15 DIAGNOSIS — Z7189 Other specified counseling: Secondary | ICD-10-CM

## 2023-02-15 DIAGNOSIS — L820 Inflamed seborrheic keratosis: Secondary | ICD-10-CM | POA: Diagnosis not present

## 2023-02-15 DIAGNOSIS — B078 Other viral warts: Secondary | ICD-10-CM

## 2023-02-15 DIAGNOSIS — Z808 Family history of malignant neoplasm of other organs or systems: Secondary | ICD-10-CM | POA: Diagnosis not present

## 2023-02-15 DIAGNOSIS — L821 Other seborrheic keratosis: Secondary | ICD-10-CM

## 2023-02-15 DIAGNOSIS — L578 Other skin changes due to chronic exposure to nonionizing radiation: Secondary | ICD-10-CM

## 2023-02-15 NOTE — Patient Instructions (Addendum)
Cryotherapy Aftercare  Wash gently with soap and water everyday.   Apply Vaseline and Band-Aid daily until healed.      Viral Warts & Molluscum Contagiosum  Viral warts and molluscum contagiosum are growths of the skin caused by viral infection of the skin. If you have been given the diagnosis of viral warts or molluscum contagiosum there are a few things that you must understand about your condition:  There is no guaranteed treatment method available for this condition. Multiple treatments may be required, The treatments may be time consuming and require multiple visits to the dermatology office. The treatment may be expensive. You will be charged each time you come into the office to have the spots treated. The treated areas may develop new lesions further complicating treatment. The treated areas may leave a scar. There is no guarantee that even after multiple treatments that the spots will be successfully treated. These are caused by a viral infection and can be spread to other areas of the skin and to other people by direct contact. Therefore, new spots may occur.   Seborrheic Keratosis  What causes seborrheic keratoses? Seborrheic keratoses are harmless, common skin growths that first appear during adult life.  As time goes by, more growths appear.  Some people may develop a large number of them.  Seborrheic keratoses appear on both covered and uncovered body parts.  They are not caused by sunlight.  The tendency to develop seborrheic keratoses can be inherited.  They vary in color from skin-colored to gray, brown, or even black.  They can be either smooth or have a rough, warty surface.   Seborrheic keratoses are superficial and look as if they were stuck on the skin.  Under the microscope this type of keratosis looks like layers upon layers of skin.  That is why at times the top layer may seem to fall off, but the rest of the growth remains and re-grows.    Treatment Seborrheic  keratoses do not need to be treated, but can easily be removed in the office.  Seborrheic keratoses often cause symptoms when they rub on clothing or jewelry.  Lesions can be in the way of shaving.  If they become inflamed, they can cause itching, soreness, or burning.  Removal of a seborrheic keratosis can be accomplished by freezing, burning, or surgery. If any spot bleeds, scabs, or grows rapidly, please return to have it checked, as these can be an indication of a skin cancer.  Due to recent changes in healthcare laws, you may see results of your pathology and/or laboratory studies on MyChart before the doctors have had a chance to review them. We understand that in some cases there may be results that are confusing or concerning to you. Please understand that not all results are received at the same time and often the doctors may need to interpret multiple results in order to provide you with the best plan of care or course of treatment. Therefore, we ask that you please give [place] 2 business days to thoroughly review all your results before contacting the office for clarification. Should we see a critical lab result, you will be contacted sooner.   If You Need Anything After Your Visit  If you have any questions or concerns for your doctor, please call our main line at [phone] and press option 4 to reach your doctor's medical assistant. If no one answers, please leave a voicemail as directed and we will return your call as soon as  possible. Messages left after 4 pm will be answered the following business day.   You may also send [place] a message via MyChart. We typically respond to MyChart messages within 1-2 business days.  For prescription refills, please ask your pharmacy to contact our office. Our fax number is [phone].  If you have an urgent issue when the clinic is closed that cannot wait until the next business day, you can page your doctor at the number below.    Please note that  while we do our best to be available for urgent issues outside of office hours, we are not available 24/7.   If you have an urgent issue and are unable to reach [place], you may choose to seek medical care at your doctor's office, retail clinic, urgent care center, or emergency room.  If you have a medical emergency, please immediately call 911 or go to the emergency department.  Pager Numbers  - Dr. Gwen Pounds: [phone]  - Dr. Roseanne Reno: [phone]  - Dr. Katrinka Blazing: [phone]   In the event of inclement weather, please call our main line at [phone] for an update on the status of any delays or closures.  Dermatology Medication Tips: Please keep the boxes that topical medications come in in order to help keep track of the instructions about where and how to use these. Pharmacies typically print the medication instructions only on the boxes and not directly on the medication tubes.   If your medication is too expensive, please contact our office at [phone] option 4 or send [place] a message through MyChart.   We are unable to tell what your co-pay for medications will be in advance as this is different depending on your insurance coverage. However, we may be able to find a substitute medication at lower cost or fill out paperwork to get insurance to cover a needed medication.   If a prior authorization is required to get your medication covered by your insurance company, please allow [place] 1-2 business days to complete this process.  Drug prices often vary depending on where the prescription is filled and some pharmacies may offer cheaper prices.  The website www.goodrx.com contains coupons for medications through different pharmacies. The prices here do not account for what the cost may be with help from insurance (it may be cheaper with your insurance), but the website can give you the price if you did not use any insurance.  - You can print the associated coupon and take it with your  prescription to the pharmacy.  - You may also stop by our office during regular business hours and pick up a GoodRx coupon card.  - If you need your prescription sent electronically to a different pharmacy, notify our office through [facility] or by phone at [phone] option 4.     Si Usted Necesita Algo Despus de Su Visita  Tambin puede enviarnos un mensaje a travs de Clinical cytogeneticist. Por lo general respondemos a los mensajes de MyChart en el transcurso de 1 a 2 das hbiles.  Para renovar recetas, por favor pida a su farmacia que se ponga en contacto con nuestra oficina. Annie Sable de fax es [employer] [phone].  Si tiene un asunto urgente cuando la clnica est cerrada y que no puede esperar hasta el siguiente da hbil, puede llamar/localizar a su doctor(a) al nmero que aparece a continuacin.   Por favor, tenga en cuenta que aunque hacemos todo lo posible para estar disponibles para asuntos urgentes fuera del horario de  oficina, no estamos disponibles las 24 horas del da, los 7 [address]  [address] de la [place].   Si tiene un problema urgente y no puede comunicarse con nosotros, puede optar por buscar atencin mdica  en el consultorio de su doctor(a), en una clnica privada, en un centro de atencin urgente o en una sala de emergencias.  Si tiene Engineer, drilling, por favor llame inmediatamente al 911 o vaya a la sala de emergencias.  Nmeros de bper  - Dr. Gwen Pounds: [phone]  - Dra. Roseanne Reno: [phone]  - Dr. Katrinka Blazing: [phone]   En caso de inclemencias del tiempo, por favor llame a Lacy Duverney principal al [phone] para una actualizacin sobre el [place] de cualquier retraso o cierre.  Consejos para la medicacin en dermatologa: Por favor, guarde las cajas en las que vienen los medicamentos de uso tpico para ayudarle a seguir las instrucciones sobre dnde y cmo usarlos. Las farmacias generalmente imprimen las instrucciones del medicamento slo en las cajas y no  directamente en los tubos del [place].   Si su medicamento es muy caro, por favor, pngase en contacto con Rolm Gala llamando al [phone] y presione la opcin 4 o envenos un mensaje a travs de Clinical cytogeneticist.   No podemos decirle cul ser su copago por los medicamentos por adelantado ya que esto es diferente dependiendo de la cobertura de su seguro. Sin embargo, es posible que podamos encontrar un medicamento sustituto a Audiological scientist un formulario para que el seguro cubra el medicamento que se considera necesario.   Si se requiere una autorizacin previa para que su compaa de seguros [place] su medicamento, por favor permtanos de 1 a 2 das hbiles para completar [address]  Los precios de los medicamentos varan con frecuencia dependiendo del Environmental consultant de dnde se surte la receta y alguna farmacias pueden ofrecer precios ms baratos.  El sitio web www.goodrx.com tiene cupones para medicamentos de Health and safety inspector. Los precios aqu no tienen en cuenta lo que podra costar con la ayuda del seguro (puede ser ms barato con su seguro), pero el sitio web puede darle el precio si no utiliz Tourist information centre manager.  - Puede imprimir el cupn correspondiente y llevarlo con su receta a la farmacia.  - Tambin puede pasar por nuestra oficina durante el horario de atencin regular y Education officer, museum una tarjeta de cupones de GoodRx.  - Si necesita que su receta se enve electrnicamente a una farmacia diferente, informe a nuestra oficina a travs de MyChart de [place] o por telfono llamando al [phone] y presione la opcin 4.

## 2023-02-15 NOTE — Progress Notes (Signed)
Follow-Up Visit   Subjective  Kyle Lee is a 52 y.o. male who presents for the following: Mole on L upper arm sensitive, check spot L upper arm, Warts R index finger did not resolve with LN2 The patient has spots, moles and lesions to be evaluated, some may be new or changing and the patient may have concern these could be cancer.  The following portions of the chart were reviewed this encounter and updated as appropriate: medications, allergies, medical history  Review of Systems:  No other skin or systemic complaints except as noted in HPI or Assessment and Plan.  Objective  Well appearing patient in no apparent distress; mood and affect are within normal limits.   A focused examination was performed of the following areas: L arm, R hand  Relevant exam findings are noted in the Assessment and Plan.  R index finger x 2 (2) Verrucous papules -- Discussed viral etiology and contagion.   L upper arm x 2 (2) Stuck on waxy paps with erythema    Assessment & Plan    Other viral warts (2) R index finger x 2  Viral Wart (HPV) Counseling  Discussed viral / HPV (Human Papilloma Virus) etiology and risk of spread /infectivity to other areas of body as well as to other people.  Multiple treatments and methods may be required to clear warts and it is possible treatment may not be successful.  Treatment risks include discoloration; scarring and there is still potential for wart recurrence.  Discussed SM wart past, pt declines today, may call in the future if he wants [place] to send in.  Destruction of lesion - R index finger x 2 (2) Complexity: simple   Destruction method: cryotherapy   Informed consent: discussed and consent obtained   Timeout:  patient name, date of birth, surgical site, and procedure verified Lesion destroyed using liquid nitrogen: Yes   Region frozen until ice ball extended beyond lesion: Yes   Outcome: patient tolerated procedure well with no  complications   Post-procedure details: wound care instructions given    Inflamed seborrheic keratosis (2) L upper arm x 2  Symptomatic, irritating, patient would like treated.  Destruction of lesion - L upper arm x 2 (2) Complexity: simple   Destruction method: cryotherapy   Informed consent: discussed and consent obtained   Timeout:  patient name, date of birth, surgical site, and procedure verified Lesion destroyed using liquid nitrogen: Yes   Region frozen until ice ball extended beyond lesion: Yes   Outcome: patient tolerated procedure well with no complications   Post-procedure details: wound care instructions given     FAMILY HISTORY OF SKIN CANCER What type(s):BCC, SCC Who affected:father   SEBORRHEIC KERATOSIS - Stuck-on, waxy, tan-brown papules and/or plaques  - Benign-appearing - Discussed benign etiology and prognosis. - Observe - Call for any changes  ACTINIC DAMAGE - chronic, secondary to cumulative UV radiation exposure/sun exposure over time - diffuse scaly erythematous macules with underlying dyspigmentation - Recommend daily broad spectrum sunscreen SPF 30+ to sun-exposed areas, reapply every 2 hours as needed.  - Recommend staying in the shade or wearing long sleeves, sun glasses (UVA+UVB protection) and wide brim hats (4-inch brim around the entire circumference of the hat). - Call for new or changing lesions.     Return for as scheduled for TBSE.  I, Ardis Rowan, RMA, am acting as scribe for Armida Sans, MD .   Documentation: I have reviewed the above documentation for accuracy and  completeness, and I agree with the above.  Armida Sans, MD

## 2023-06-15 ENCOUNTER — Ambulatory Visit: Payer: Commercial Managed Care - PPO | Admitting: Dermatology

## 2023-07-19 ENCOUNTER — Ambulatory Visit: Payer: Commercial Managed Care - PPO | Admitting: Dermatology

## 2023-08-11 ENCOUNTER — Encounter: Payer: Self-pay | Admitting: Dermatology

## 2023-08-11 ENCOUNTER — Ambulatory Visit: Payer: Commercial Managed Care - PPO | Admitting: Dermatology

## 2023-08-11 DIAGNOSIS — L818 Other specified disorders of pigmentation: Secondary | ICD-10-CM

## 2023-08-11 DIAGNOSIS — Z85828 Personal history of other malignant neoplasm of skin: Secondary | ICD-10-CM | POA: Diagnosis not present

## 2023-08-11 DIAGNOSIS — W908XXA Exposure to other nonionizing radiation, initial encounter: Secondary | ICD-10-CM | POA: Diagnosis not present

## 2023-08-11 DIAGNOSIS — L814 Other melanin hyperpigmentation: Secondary | ICD-10-CM | POA: Diagnosis not present

## 2023-08-11 DIAGNOSIS — L578 Other skin changes due to chronic exposure to nonionizing radiation: Secondary | ICD-10-CM

## 2023-08-11 DIAGNOSIS — Z1283 Encounter for screening for malignant neoplasm of skin: Secondary | ICD-10-CM

## 2023-08-11 DIAGNOSIS — D1801 Hemangioma of skin and subcutaneous tissue: Secondary | ICD-10-CM

## 2023-08-11 DIAGNOSIS — L821 Other seborrheic keratosis: Secondary | ICD-10-CM | POA: Diagnosis not present

## 2023-08-11 DIAGNOSIS — B078 Other viral warts: Secondary | ICD-10-CM

## 2023-08-11 DIAGNOSIS — Z808 Family history of malignant neoplasm of other organs or systems: Secondary | ICD-10-CM

## 2023-08-11 DIAGNOSIS — Z872 Personal history of diseases of the skin and subcutaneous tissue: Secondary | ICD-10-CM

## 2023-08-11 DIAGNOSIS — Z86018 Personal history of other benign neoplasm: Secondary | ICD-10-CM | POA: Diagnosis not present

## 2023-08-11 DIAGNOSIS — D229 Melanocytic nevi, unspecified: Secondary | ICD-10-CM

## 2023-08-11 NOTE — Patient Instructions (Addendum)

## 2023-08-11 NOTE — Progress Notes (Signed)
 Follow-Up Visit   Subjective  Kyle Lee is a 53 y.o. male who presents for the following: Skin Cancer Screening and Full Body Skin Exam, hx of BCCs, Dysplastic Nevi, Aks, wart R index finger did not clear with recent LN2, Wart R index finger, R foot hx of LN2 with no change, salicylic acid  q 2 wks, pt has been using salicylic acid, check spot L shoulder ~57m, no symptoms, hx of Grover's Disease chest, not using Clindamycin lotion, using otc Differin gel  The patient presents for Total-Body Skin Exam (TBSE) for skin cancer screening and mole check. The patient has spots, moles and lesions to be evaluated, some may be new or changing and the patient may have concern these could be cancer.    The following portions of the chart were reviewed this encounter and updated as appropriate: medications, allergies, medical history  Review of Systems:  No other skin or systemic complaints except as noted in HPI or Assessment and Plan.  Objective  Well appearing patient in no apparent distress; mood and affect are within normal limits.  A full examination was performed including scalp, head, eyes, ears, nose, lips, neck, chest, axillae, abdomen, back, buttocks, bilateral upper extremities, bilateral lower extremities, hands, feet, fingers, toes, fingernails, and toenails. All findings within normal limits unless otherwise noted below.   Relevant physical exam findings are noted in the Assessment and Plan.  R index finger x 1 Verrucous papules -- Discussed viral etiology and contagion.   Assessment & Plan   SKIN CANCER SCREENING PERFORMED TODAY.  ACTINIC DAMAGE - Chronic condition, secondary to cumulative UV/sun exposure - diffuse scaly erythematous macules with underlying dyspigmentation - Recommend daily broad spectrum sunscreen SPF 30+ to sun-exposed areas, reapply every 2 hours as needed.  - Staying in the shade or wearing long sleeves, sun glasses (UVA+UVB protection) and wide brim  hats (4-inch brim around the entire circumference of the hat) are also recommended for sun protection.  - Call for new or changing lesions.  LENTIGINES, SEBORRHEIC KERATOSES, HEMANGIOMAS - Benign normal skin lesions - Benign-appearing - Call for any changes  MELANOCYTIC NEVI - Tan-brown and/or pink-flesh-colored symmetric macules and papules - Benign appearing on exam today - Observation - Call clinic for new or changing moles - Recommend daily use of broad spectrum spf 30+ sunscreen to sun-exposed areas.   HISTORY OF BASAL CELL CARCINOMA OF THE SKIN - No evidence of recurrence today - Recommend regular full body skin exams - Recommend daily broad spectrum sunscreen SPF 30+ to sun-exposed areas, reapply every 2 hours as needed.  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  Call if any new or changing lesions are noted between office visits  - R proximal post lat thigh, multiple other sites  HISTORY OF PRECANCEROUS ACTINIC KERATOSIS - site(s) of PreCancerous Actinic Keratosis clear today. - these may recur and new lesions may form requiring treatment to prevent transformation into skin cancer - observe for new or changing spots and contact [facility] for appointment if occur - photoprotection with sun protective clothing; sunglasses and broad spectrum sunscreen with SPF of at least 30 + and frequent self skin exams recommended - yearly exams by a dermatologist recommended for persons  with history of PreCancerous Actinic Keratoses   FAMILY HISTORY OF SKIN CANCER What type(s):BCC, SCC Who affected:father   GRAPHITE TATTOO Secondary to pencil injury in past per patient report L glabella Exam: gray macule on  left glabella  Treatment Plan: Benign, observe  OTHER VIRAL WARTS R index finger x 1 Viral Wart (HPV) Counseling  Discussed viral / HPV (Human Papilloma Virus) etiology and risk of spread /infectivity to other areas of body as well as to other people.  Multiple treatments and methods may be required to clear warts and it is possible treatment may not be successful.  Treatment risks include discoloration; scarring and there is still potential for wart recurrence. Destruction of lesion - R index finger x 1 Complexity: simple   Destruction method: cryotherapy   Informed consent: discussed and consent obtained   Timeout:  patient name, date of birth, surgical site, and procedure verified Lesion destroyed using liquid nitrogen: Yes   Region frozen until ice ball extended beyond lesion: Yes   Cryo cycles: 1 or 2. Outcome: patient tolerated procedure well with no complications   Post-procedure details: wound care instructions given   MULTIPLE BENIGN NEVI   LENTIGINES   ACTINIC ELASTOSIS   CHERRY ANGIOMA   SEBORRHEIC KERATOSES   Return in about 1 year (around 08/10/2024) for TBSE, Hx of BCC, Hx of Dysplastic nevi, Hx of AKs.  I, Ardis Rowan, RMA, am acting as scribe for Elie Goody, MD .   Documentation: I have reviewed the above documentation for accuracy and completeness, and I agree with the above.  Elie Goody, MD

## 2023-09-26 DIAGNOSIS — H902 Conductive hearing loss, unspecified: Secondary | ICD-10-CM | POA: Diagnosis not present

## 2023-09-26 DIAGNOSIS — H6123 Impacted cerumen, bilateral: Secondary | ICD-10-CM | POA: Diagnosis not present

## 2023-09-27 ENCOUNTER — Other Ambulatory Visit: Payer: Self-pay

## 2023-09-27 DIAGNOSIS — Z1331 Encounter for screening for depression: Secondary | ICD-10-CM | POA: Diagnosis not present

## 2023-09-27 DIAGNOSIS — Z0000 Encounter for general adult medical examination without abnormal findings: Secondary | ICD-10-CM | POA: Diagnosis not present

## 2023-09-27 DIAGNOSIS — J302 Other seasonal allergic rhinitis: Secondary | ICD-10-CM | POA: Diagnosis not present

## 2023-09-27 DIAGNOSIS — Z23 Encounter for immunization: Secondary | ICD-10-CM | POA: Diagnosis not present

## 2023-09-27 DIAGNOSIS — G4700 Insomnia, unspecified: Secondary | ICD-10-CM | POA: Diagnosis not present

## 2023-09-27 MED ORDER — TRAZODONE HCL 50 MG PO TABS
50.0000 mg | ORAL_TABLET | Freq: Every evening | ORAL | 1 refills | Status: AC | PRN
  Filled 2023-09-27: qty 90, 90d supply, fill #0

## 2023-09-30 ENCOUNTER — Other Ambulatory Visit: Payer: Self-pay

## 2023-10-07 DIAGNOSIS — J302 Other seasonal allergic rhinitis: Secondary | ICD-10-CM | POA: Diagnosis not present

## 2023-10-07 DIAGNOSIS — G4700 Insomnia, unspecified: Secondary | ICD-10-CM | POA: Diagnosis not present

## 2023-10-07 DIAGNOSIS — Z1331 Encounter for screening for depression: Secondary | ICD-10-CM | POA: Diagnosis not present

## 2023-10-07 DIAGNOSIS — Z0000 Encounter for general adult medical examination without abnormal findings: Secondary | ICD-10-CM | POA: Diagnosis not present

## 2023-12-28 DIAGNOSIS — H40033 Anatomical narrow angle, bilateral: Secondary | ICD-10-CM | POA: Diagnosis not present

## 2024-01-12 ENCOUNTER — Encounter: Payer: Self-pay | Admitting: Dermatology

## 2024-01-12 ENCOUNTER — Ambulatory Visit (INDEPENDENT_AMBULATORY_CARE_PROVIDER_SITE_OTHER): Admitting: Dermatology

## 2024-01-12 DIAGNOSIS — Z86018 Personal history of other benign neoplasm: Secondary | ICD-10-CM | POA: Diagnosis not present

## 2024-01-12 DIAGNOSIS — L820 Inflamed seborrheic keratosis: Secondary | ICD-10-CM | POA: Diagnosis not present

## 2024-01-12 DIAGNOSIS — L988 Other specified disorders of the skin and subcutaneous tissue: Secondary | ICD-10-CM | POA: Diagnosis not present

## 2024-01-12 DIAGNOSIS — Z85828 Personal history of other malignant neoplasm of skin: Secondary | ICD-10-CM

## 2024-01-12 DIAGNOSIS — L249 Irritant contact dermatitis, unspecified cause: Secondary | ICD-10-CM

## 2024-01-12 NOTE — Progress Notes (Signed)
   Follow-Up Visit   Subjective  Kyle Lee is a 53 y.o. male who presents for the following: Patient has areas of concern on his forehead, they have been painful for the past couple of weeks. Hx of dysplastic nevi and BCC. Patient also has area of discoloration in the right groin; has been using antifungal spray.    The following portions of the chart were reviewed this encounter and updated as appropriate: medications, allergies, medical history  Review of Systems:  No other skin or systemic complaints except as noted in HPI or Assessment and Plan.  Objective  Well appearing patient in no apparent distress; mood and affect are within normal limits.   A focused examination was performed of the following areas: Face, Groin, Lower back  Relevant exam findings are noted in the Assessment and Plan.  L superior forehead x2, L inferior forehead x 2 (4) Stuck on waxy paps with erythema  Assessment & Plan   HISTORY OF DYSPLASTIC NEVUS No evidence of recurrence today Recommend regular full body skin exams Recommend daily broad spectrum sunscreen SPF 30+ to sun-exposed areas, reapply every 2 hours as needed.  Call if any new or changing lesions are noted between office visits  - R proximal post lat thigh, multiple other sites  HISTORY OF BASAL CELL CARCINOMA OF THE SKIN - No evidence of recurrence today - Recommend regular full body skin exams - Recommend daily broad spectrum sunscreen SPF 30+ to sun-exposed areas, reapply every 2 hours as needed.  - Call if any new or changing lesions are noted between office visits  Irritation to the R groin, not improved with tinactin spray - exam: slightly hyperpigmented patch R medial thigh - Start OTC hydrocortisone cream BID until clear.  Topical steroids (such as triamcinolone, fluocinolone, fluocinonide, mometasone, clobetasol, halobetasol, betamethasone, hydrocortisone) can cause thinning and lightening of the skin if they are used  for too long in the same area. Your physician has selected the right strength medicine for your problem and area affected on the body. Please use your medication only as directed by your physician to prevent side effects.    INFLAMED SEBORRHEIC KERATOSIS (4) L superior forehead x2, L inferior forehead x 2 (4) Symptomatic, irritating, patient would like treated. Destruction of lesion - L superior forehead x2, L inferior forehead x 2 (4) Complexity: simple   Destruction method: cryotherapy   Informed consent: discussed and consent obtained   Timeout:  patient name, date of birth, surgical site, and procedure verified Lesion destroyed using liquid nitrogen: Yes   Region frozen until ice ball extended beyond lesion: Yes   Cryo cycles: 1 or 2. Outcome: patient tolerated procedure well with no complications   Post-procedure details: wound care instructions given   Additional details:  Prior to procedure, discussed risks of blister formation, small wound, skin dyspigmentation, or rare scar following cryotherapy. Recommend Vaseline ointment to treated areas while healing.   IRRITANT CONTACT DERMATITIS, UNSPECIFIED TRIGGER    No follow-ups on file.  I, Emerick Ege, CMA am acting as scribe for Boneta Sharps, MD.   Documentation: I have reviewed the above documentation for accuracy and completeness, and I agree with the above.  Boneta Sharps, MD

## 2024-01-12 NOTE — Patient Instructions (Addendum)
 Start OTC Hydrocortisone cream for area of concern in groin  Topical steroids (such as triamcinolone, fluocinolone, fluocinonide, mometasone, clobetasol, halobetasol, betamethasone, hydrocortisone) can cause thinning and lightening of the skin if they are used for too long in the same area. Your physician has selected the right strength medicine for your problem and area affected on the body. Please use your medication only as directed by your physician to prevent side effects.   Cryotherapy Aftercare  Wash gently with soap and water everyday.   Apply Vaseline and Band-Aid daily until healed.   Due to recent changes in healthcare laws, you may see results of your pathology and/or laboratory studies on MyChart before the doctors have had a chance to review them. We understand that in some cases there may be results that are confusing or concerning to you. Please understand that not all results are received at the same time and often the doctors may need to interpret multiple results in order to provide you with the best plan of care or course of treatment. Therefore, we ask that you please give us  2 business days to thoroughly review all your results before contacting the office for clarification. Should we see a critical lab result, you will be contacted sooner.   If You Need Anything After Your Visit  If you have any questions or concerns for your doctor, please call our main line at [phone] and press option 4 to reach your doctor's medical assistant. If no one answers, please leave a voicemail as directed and we will return your call as soon as possible. Messages left after 4 pm will be answered the following business day.   You may also send us  a message via MyChart. We typically respond to MyChart messages within 1-2 business days.  For prescription refills, please ask your pharmacy to contact our office. Our fax number is [phone].  If you have an urgent issue when the clinic is  closed that cannot wait until the next business day, you can page your doctor at the number below.    Please note that while we do our best to be available for urgent issues outside of office hours, we are not available 24/7.   If you have an urgent issue and are unable to reach us , you may choose to seek medical care at your doctor's office, retail clinic, urgent care center, or emergency room.  If you have a medical emergency, please immediately call 911 or go to the emergency department.  Pager Numbers  - Dr. Hester: [phone]  - Dr. Jackquline: [phone]  - Dr. Claudene: [phone]   - Dr. Raymund: [phone]  In the event of inclement weather, please call our main line at [phone] for an update on the status of any delays or closures.  Dermatology Medication Tips: Please keep the boxes that topical medications come in in order to help keep track of the instructions about where and how to use these. Pharmacies typically print the medication instructions only on the boxes and not directly on the medication tubes.   If your medication is too expensive, please contact our office at [phone] option 4 or send us  a message through MyChart.   We are unable to tell what your co-pay for medications will be in advance as this is different depending on your insurance coverage. However, we may be able to find a substitute medication at lower cost or fill out paperwork to get insurance to cover a needed medication.   If a prior  authorization is required to get your medication covered by your insurance company, please allow us  1-2 business days to complete this process.  Drug prices often vary depending on where the prescription is filled and some pharmacies may offer cheaper prices.  The website www.goodrx.com contains coupons for medications through different pharmacies. The prices here do not account for what the cost may be with help from insurance (it may be cheaper with your  insurance), but the website can give you the price if you did not use any insurance.  - You can print the associated coupon and take it with your prescription to the pharmacy.  - You may also stop by our office during regular business hours and pick up a GoodRx coupon card.  - If you need your prescription sent electronically to a different pharmacy, notify our office through [facility] or by phone at [phone] option 4.     Si Usted Necesita Algo Despus de Su Visita  Tambin puede enviarnos un mensaje a travs de Clinical cytogeneticist. Por lo general respondemos a los mensajes de MyChart en el transcurso de 1 a 2 das hbiles.  Para renovar recetas, por favor pida a su farmacia que se ponga en contacto con nuestra oficina. Randi lakes de fax es [place] [phone].  Si tiene un asunto urgente cuando la clnica est cerrada y que no puede esperar hasta el siguiente da hbil, puede llamar/localizar a su doctor(a) al nmero que aparece a continuacin.   Por favor, tenga en cuenta que aunque hacemos todo lo posible para estar disponibles para asuntos urgentes fuera del horario de [place], no estamos disponibles las 24 horas del da, los 7 [address]  [address] de la [place].   Si tiene un problema urgente y no puede comunicarse con nosotros, puede optar por buscar atencin mdica  en el consultorio de su doctor(a), en una clnica privada, en un centro de atencin urgente o en una sala de emergencias.  Si tiene Engineer, drilling, por favor llame inmediatamente al 911 o vaya a la sala de emergencias.  Nmeros de bper  - Dr. Hester: [phone]  - Dra. Jackquline: [phone]  - Dr. Claudene: [phone]  - Dra. Kitts: [phone]  En caso de inclemencias del [place], por favor llame a nuestra lnea principal al [phone] para una actualizacin sobre el estado de cualquier retraso o cierre.  Consejos para la medicacin en dermatologa: Por favor, guarde las cajas en las que vienen los medicamentos  de uso tpico para ayudarle a seguir las instrucciones sobre dnde y cmo usarlos. Las farmacias generalmente imprimen las instrucciones del medicamento slo en las cajas y no directamente en los tubos del [place].   Si su medicamento es muy caro, por favor, pngase en contacto con landry rieger llamando al [phone] y presione la opcin 4 o envenos un mensaje a travs de Clinical cytogeneticist.   No podemos decirle cul ser su copago por los medicamentos por adelantado ya que esto es diferente dependiendo de la cobertura de su seguro. Sin embargo, es posible que podamos encontrar un medicamento sustituto a Audiological scientist un formulario para que el seguro cubra el medicamento que se considera necesario.   Si se requiere una autorizacin previa para que su compaa de seguros malta su medicamento, por favor permtanos de 1 a 2 das hbiles para completar este proceso.  Los precios de los medicamentos varan con frecuencia dependiendo del Environmental consultant de dnde se surte la receta y alguna farmacias pueden ofrecer precios ms baratos.  El sitio  web www.goodrx.com tiene cupones para medicamentos de Health and safety inspector. Los precios aqu no tienen en cuenta lo que podra costar con la ayuda del seguro (puede ser ms barato con su seguro), pero el sitio web puede darle el precio si no utiliz Tourist information centre manager.  - Puede imprimir el cupn correspondiente y llevarlo con su receta a la farmacia.  - Tambin puede pasar por nuestra oficina durante el horario de atencin regular y Education officer, museum una tarjeta de cupones de GoodRx.  - Si necesita que su receta se enve electrnicamente a una farmacia diferente, informe a nuestra oficina a travs de MyChart de  o por telfono llamando al [phone] y presione la opcin 4.

## 2024-02-08 DIAGNOSIS — H524 Presbyopia: Secondary | ICD-10-CM | POA: Diagnosis not present

## 2024-08-09 ENCOUNTER — Ambulatory Visit: Admitting: Dermatology
